# Patient Record
Sex: Male | Born: 1955 | Race: White | Hispanic: No | Marital: Married | Smoking: Never smoker
Health system: Southern US, Community
[De-identification: ages and names within clinical notes are randomized; demographics above are authoritative.]

## PROBLEM LIST (undated history)

## (undated) DIAGNOSIS — M75100 Unspecified rotator cuff tear or rupture of unspecified shoulder, not specified as traumatic: Secondary | ICD-10-CM

## (undated) DIAGNOSIS — G8929 Other chronic pain: Secondary | ICD-10-CM

## (undated) DIAGNOSIS — E785 Hyperlipidemia, unspecified: Secondary | ICD-10-CM

## (undated) DIAGNOSIS — K279 Peptic ulcer, site unspecified, unspecified as acute or chronic, without hemorrhage or perforation: Secondary | ICD-10-CM

## (undated) DIAGNOSIS — E119 Type 2 diabetes mellitus without complications: Secondary | ICD-10-CM

## (undated) DIAGNOSIS — D649 Anemia, unspecified: Secondary | ICD-10-CM

## (undated) HISTORY — DX: Type 2 diabetes mellitus without complications: E11.9

## (undated) HISTORY — DX: Peptic ulcer, site unspecified, unspecified as acute or chronic, without hemorrhage or perforation: K27.9

## (undated) HISTORY — DX: Hyperlipidemia, unspecified: E78.5

## (undated) HISTORY — DX: Anemia, unspecified: D64.9

## (undated) HISTORY — PX: BACK SURGERY: SHX140

## (undated) HISTORY — DX: Other chronic pain: G89.29

---

## 1998-07-29 ENCOUNTER — Inpatient Hospital Stay (HOSPITAL_COMMUNITY): Admission: EM | Admit: 1998-07-29 | Discharge: 1998-07-31 | Payer: Self-pay | Admitting: Emergency Medicine

## 1998-07-29 ENCOUNTER — Encounter: Payer: Self-pay | Admitting: Neurosurgery

## 1998-07-29 ENCOUNTER — Encounter: Payer: Self-pay | Admitting: Emergency Medicine

## 1999-10-23 ENCOUNTER — Encounter: Payer: Self-pay | Admitting: Neurosurgery

## 1999-10-23 ENCOUNTER — Encounter: Admission: RE | Admit: 1999-10-23 | Discharge: 1999-10-23 | Payer: Self-pay | Admitting: Neurosurgery

## 1999-10-28 ENCOUNTER — Encounter: Payer: Self-pay | Admitting: Neurosurgery

## 1999-10-28 ENCOUNTER — Inpatient Hospital Stay (HOSPITAL_COMMUNITY): Admission: EM | Admit: 1999-10-28 | Discharge: 1999-10-30 | Payer: Self-pay | Admitting: Emergency Medicine

## 1999-10-28 ENCOUNTER — Encounter: Payer: Self-pay | Admitting: Emergency Medicine

## 1999-10-29 ENCOUNTER — Encounter: Payer: Self-pay | Admitting: Neurosurgery

## 2002-10-23 ENCOUNTER — Emergency Department (HOSPITAL_COMMUNITY): Admission: EM | Admit: 2002-10-23 | Discharge: 2002-10-23 | Payer: Self-pay | Admitting: Emergency Medicine

## 2004-04-14 ENCOUNTER — Inpatient Hospital Stay (HOSPITAL_COMMUNITY): Admission: RE | Admit: 2004-04-14 | Discharge: 2004-04-20 | Payer: Self-pay | Admitting: Neurosurgery

## 2019-09-04 ENCOUNTER — Encounter: Payer: Self-pay | Admitting: Gastroenterology

## 2019-11-28 ENCOUNTER — Encounter: Payer: Self-pay | Admitting: Gastroenterology

## 2019-11-28 ENCOUNTER — Ambulatory Visit (INDEPENDENT_AMBULATORY_CARE_PROVIDER_SITE_OTHER): Payer: No Typology Code available for payment source | Admitting: Gastroenterology

## 2019-11-28 ENCOUNTER — Other Ambulatory Visit: Payer: Self-pay

## 2019-11-28 DIAGNOSIS — D509 Iron deficiency anemia, unspecified: Secondary | ICD-10-CM | POA: Diagnosis not present

## 2019-11-28 NOTE — Progress Notes (Signed)
Primary Care Physician:  No primary care provider on file.  Referring Physician: Ave Filter  Primary Gastroenterologist:  Dr. Abbey Chatters  Chief Complaint  Patient presents with  . Colonoscopy    never had tcs  . Anemia    HPI:   Charles Blackwell is a 64 y.o. male presenting today at the request of Dr. Vonna Drafts with the [facility] due to chronic IDA. I do not have iron studies, but last Hgb in May 2021 was 12.1. creatinine 0.883. Heme negative per patient.  He denies any abdominal pain, N/V, loss of appetite, dysphagia, GERD, jaundice, pruritis, melena, hematochezia, changes in bowel habits. 15-18 lbs weight loss subjectively over last few years. Unintentionally. States worked a lot and didn't have time to eat.   No prior colonoscopy/EGD. No family history of colorectal cancer or polyps that he is aware. He is not interested in colonoscopy or endoscopy. States he does not want to go through the process of procedures and even if he had a malignancy, he would not want to do anything. States there is no reason to pursue procedures in this case. Ready to go when it is his time. He states he does not know why he is even here.    Past Medical History:  Diagnosis Date  . Chronic back pain   . Diabetes [facility])     Past Surgical History:  Procedure Laterality Date  . BACK SURGERY     X 4, rods and pins, took bone out of hip and put in back, removal of rods     Current Outpatient Medications  Medication Sig Dispense Refill  . atorvastatin (LIPITOR) 10 MG tablet Take 10 mg by mouth daily.    . cholecalciferol (VITAMIN D) 25 MCG (1000 UNIT) tablet Take 1,000 Units by mouth daily.    Marland Kitchen glipiZIDE (GLUCOTROL) 5 MG tablet Take 5 mg by mouth daily before breakfast.    . metFORMIN (GLUCOPHAGE) 500 MG tablet Take 1 tablet by mouth 2 (two) times daily.    . methadone (DOLOPHINE) 5 MG tablet Take 1 tablet by mouth every 12 (twelve) hours.    Marland Kitchen oxyCODONE (OXY IR/ROXICODONE) 5 MG immediate release tablet  Take 5 mg by mouth 3 (three) times daily.     No current facility-administered medications for this visit.    Allergies as of 11/28/2019  . (No Known Allergies)    Family History  Problem Relation Age of Onset  . Cancer Mother        unknown kind of cancer  . Colon cancer Neg Hx   . Colon polyps Neg Hx     Social History   Socioeconomic History  . Marital status: Married    Spouse name: Not on file  . Number of children: Not on file  . Years of education: Not on file  . Highest education level: Not on file  Occupational History  . Occupation: disability  Tobacco Use  . Smoking status: Never Smoker  . Smokeless tobacco: Never Used  Substance and Sexual Activity  . Alcohol use: Never  . Drug use: Not Currently  . Sexual activity: Not on file  Other Topics Concern  . Not on file  Social History Narrative  . Not on file   Social Determinants of Health   Financial Resource Strain:   . Difficulty of Paying Living Expenses: Not on file  Food Insecurity:   . Worried About Charity fundraiser in the Last Year: Not on file  .  Ran Out of Food in the Last Year: Not on file  Transportation Needs:   . Lack of Transportation (Medical): Not on file  . Lack of Transportation (Non-Medical): Not on file  Physical Activity:   . Days of Exercise per Week: Not on file  . Minutes of Exercise per Session: Not on file  Stress:   . Feeling of Stress : Not on file  Social Connections:   . Frequency of Communication with Friends and Family: Not on file  . Frequency of Social Gatherings with Friends and Family: Not on file  . Attends Religious Services: Not on file  . Active Member of Clubs or Organizations: Not on file  . Attends Archivist Meetings: Not on file  . Marital Status: Not on file  Intimate Partner Violence:   . Fear of Current or Ex-Partner: Not on file  . Emotionally Abused: Not on file  . Physically Abused: Not on file  . Sexually Abused: Not on file     Review of Systems: Gen: see HPI CV: Denies chest pain, heart palpitations, peripheral edema, syncope.  Resp: Denies shortness of breath at rest or with exertion. Denies wheezing or cough.  GI: see HPI GU : Denies urinary burning, urinary frequency, urinary hesitancy MS: chronic back pain  Derm: Denies rash, itching, dry skin Psych: Denies depression, anxiety, memory loss, and confusion Heme: Denies bruising, bleeding, and enlarged lymph nodes.  Physical Exam: BP 125/70   Pulse 67   Temp 97.7 F (36.5 C) (Temporal)   Ht 6' (1.829 m)   Wt 200 lb 12.8 oz (91.1 kg)   BMI 27.23 kg/m  General:   Alert and oriented. Flat affect.  Head:  Normocephalic and atraumatic. Eyes:  Without icterus, sclera clear and conjunctiva pink.  Ears:  Normal auditory acuity. Mouth:  Mask in place Lungs:  Clear to auscultation bilaterally. No wheezes, rales, or rhonchi. No distress.  Heart:  S1, S2 present with possible slight systolic murmur Abdomen:  +BS, soft, non-tender and non-distended. No HSM noted. No guarding or rebound. No masses appreciated.  Rectal:  Deferred  Msk:  Symmetrical without gross deformities. Normal posture. Extremities:  Without edema. Neurologic:  Alert and  oriented x4;  grossly normal neurologically. Skin:  Intact without significant lesions or rashes. Psych:  Alert and cooperative.  ASSESSMENT/Plan: [name] is a 64 y.o. male presenting today with chronic IDA per referral notes, last Hgb 12.1, hemoccult negative per patient. I do not have additional iron studies at time of visit. No prior endoscopic procedures and no family history of colorectal cancer or polyps.  He is declining any endoscopic evaluation at this time, stating it would not matter as he does not want to do anything if malignancy was found. I discussed at length with him potential differentials including but not limited to malignancy; I discussed with him that pursuing diagnostic procedures would  investigate GI source for IDA, which is concerning. He is fully aware of this and still declining.   Quite flat affect today, and I am unsure of his baseline. No concern for self-harm. I encouraged him to still consider diagnostic procedures and to call if he changes his mind. As he does not want to pursue any further testing, we will see him back as needed.    Annitta Needs, PhD, ANP-BC [facility] Gastroenterology

## 2019-11-28 NOTE — Patient Instructions (Signed)
Please let me know if you change your mind about the procedures. We would schedule an upper endoscopy and colonoscopy for you.  I am very sorry about your wait time today. Thank you for being understanding.  It was a pleasure to see you today. I want to create trusting relationships with patients to provide genuine, compassionate, and quality care. I value your feedback. If you receive a survey regarding your visit,  I greatly appreciate you taking time to fill this out.   Annitta Needs, PhD, ANP-BC [facility] Gastroenterology

## 2020-06-03 DIAGNOSIS — K922 Gastrointestinal hemorrhage, unspecified: Secondary | ICD-10-CM

## 2020-06-03 HISTORY — DX: Gastrointestinal hemorrhage, unspecified: K92.2

## 2020-06-18 ENCOUNTER — Encounter: Payer: Self-pay | Admitting: Cardiology

## 2020-06-18 NOTE — Progress Notes (Signed)
Cardiology on-call:  Received a page from [facility] stating that [facility] ER provider and would like to speak to cardiology on call.  Spoke to the ER physician Dr. Ramiro Harvest regarding Mr. Duerson who presents to their ER complaining of chest pain and provider concerned for unstable angina.  Based on the brief discussion over the phone it appears that the patient comes in with chest pain that has been progressive.  Current work-up in the ED notes hemoglobin of 4 g/dL.  High sensitive troponin 17 and second troponin pending.  BNP mildly elevated.  They are requesting a transfer to [facility].  I informed Dr. Ramiro Harvest that the patient's chest discomfort is most likely secondary to supply demand ischemia given his severe symptomatic anemia.  He needs to be admitted to hospitalist service with GI consult and will require blood transfusions and reevaluation with regards to his chest discomfort.  Given his other noncardiac conditions he should be admitted to hospitalist team and cardiology will follow the patient as consultants when he presents to [facility].  I will request hospitalist team to page once the patient comes to [facility].  Dr. Ramiro Harvest agreed with the plan of care and was thankful for taking the phone call.  Rex Kras, [place], [facility]  Pager: [phone] Office: [phone]

## 2020-06-20 ENCOUNTER — Inpatient Hospital Stay (HOSPITAL_COMMUNITY)
Admission: AD | Admit: 2020-06-20 | Discharge: 2020-06-22 | DRG: 378 | Disposition: A | Discharge status: Home / Self Care | Payer: No Typology Code available for payment source | Source: Other Acute Inpatient Hospital | Attending: Internal Medicine | Admitting: Internal Medicine

## 2020-06-20 DIAGNOSIS — Z79890 Hormone replacement therapy (postmenopausal): Secondary | ICD-10-CM | POA: Diagnosis not present

## 2020-06-20 DIAGNOSIS — I452 Bifascicular block: Secondary | ICD-10-CM | POA: Diagnosis present

## 2020-06-20 DIAGNOSIS — E119 Type 2 diabetes mellitus without complications: Secondary | ICD-10-CM

## 2020-06-20 DIAGNOSIS — K2100 Gastro-esophageal reflux disease with esophagitis, without bleeding: Secondary | ICD-10-CM | POA: Diagnosis present

## 2020-06-20 DIAGNOSIS — E7800 Pure hypercholesterolemia, unspecified: Secondary | ICD-10-CM

## 2020-06-20 DIAGNOSIS — Z8249 Family history of ischemic heart disease and other diseases of the circulatory system: Secondary | ICD-10-CM | POA: Diagnosis not present

## 2020-06-20 DIAGNOSIS — M545 Low back pain, unspecified: Secondary | ICD-10-CM | POA: Diagnosis present

## 2020-06-20 DIAGNOSIS — D124 Benign neoplasm of descending colon: Secondary | ICD-10-CM | POA: Diagnosis present

## 2020-06-20 DIAGNOSIS — K259 Gastric ulcer, unspecified as acute or chronic, without hemorrhage or perforation: Secondary | ICD-10-CM | POA: Diagnosis not present

## 2020-06-20 DIAGNOSIS — G8929 Other chronic pain: Secondary | ICD-10-CM | POA: Diagnosis present

## 2020-06-20 DIAGNOSIS — R072 Precordial pain: Secondary | ICD-10-CM | POA: Diagnosis present

## 2020-06-20 DIAGNOSIS — K635 Polyp of colon: Secondary | ICD-10-CM | POA: Diagnosis not present

## 2020-06-20 DIAGNOSIS — I249 Acute ischemic heart disease, unspecified: Secondary | ICD-10-CM | POA: Diagnosis not present

## 2020-06-20 DIAGNOSIS — Z79891 Long term (current) use of opiate analgesic: Secondary | ICD-10-CM | POA: Diagnosis not present

## 2020-06-20 DIAGNOSIS — D696 Thrombocytopenia, unspecified: Secondary | ICD-10-CM | POA: Diagnosis present

## 2020-06-20 DIAGNOSIS — D500 Iron deficiency anemia secondary to blood loss (chronic): Secondary | ICD-10-CM | POA: Diagnosis present

## 2020-06-20 DIAGNOSIS — R079 Chest pain, unspecified: Secondary | ICD-10-CM | POA: Diagnosis present

## 2020-06-20 DIAGNOSIS — E291 Testicular hypofunction: Secondary | ICD-10-CM | POA: Diagnosis present

## 2020-06-20 DIAGNOSIS — K250 Acute gastric ulcer with hemorrhage: Secondary | ICD-10-CM | POA: Diagnosis present

## 2020-06-20 DIAGNOSIS — E785 Hyperlipidemia, unspecified: Secondary | ICD-10-CM | POA: Diagnosis present

## 2020-06-20 DIAGNOSIS — I248 Other forms of acute ischemic heart disease: Secondary | ICD-10-CM | POA: Diagnosis present

## 2020-06-20 DIAGNOSIS — Z7984 Long term (current) use of oral hypoglycemic drugs: Secondary | ICD-10-CM | POA: Diagnosis not present

## 2020-06-20 DIAGNOSIS — I209 Angina pectoris, unspecified: Secondary | ICD-10-CM | POA: Diagnosis not present

## 2020-06-20 DIAGNOSIS — R0609 Other forms of dyspnea: Secondary | ICD-10-CM

## 2020-06-20 DIAGNOSIS — Z01810 Encounter for preprocedural cardiovascular examination: Secondary | ICD-10-CM

## 2020-06-20 DIAGNOSIS — R0600 Dyspnea, unspecified: Secondary | ICD-10-CM

## 2020-06-20 DIAGNOSIS — D509 Iron deficiency anemia, unspecified: Secondary | ICD-10-CM | POA: Diagnosis not present

## 2020-06-20 DIAGNOSIS — D649 Anemia, unspecified: Secondary | ICD-10-CM

## 2020-06-20 DIAGNOSIS — I4510 Unspecified right bundle-branch block: Secondary | ICD-10-CM

## 2020-06-20 LAB — CBC
HCT: 21.8 % — ABNORMAL LOW (ref 39.0–52.0)
Hemoglobin: 6.7 g/dL — CL (ref 13.0–17.0)
MCH: 24.3 pg — ABNORMAL LOW (ref 26.0–34.0)
MCHC: 30.7 g/dL (ref 30.0–36.0)
MCV: 79 fL — ABNORMAL LOW (ref 80.0–100.0)
Platelets: 154 10*3/uL (ref 150–400)
RBC: 2.76 MIL/uL — ABNORMAL LOW (ref 4.22–5.81)
RDW: 18.6 % — ABNORMAL HIGH (ref 11.5–15.5)
WBC: 6.3 10*3/uL (ref 4.0–10.5)
nRBC: 0 % (ref 0.0–0.2)

## 2020-06-20 LAB — FOLATE: Folate: 12.5 ng/mL (ref >5.9)

## 2020-06-20 LAB — COMPREHENSIVE METABOLIC PANEL
ALT: 25 U/L (ref 0–44)
AST: 24 U/L (ref 15–41)
Albumin: 2.6 g/dL — ABNORMAL LOW (ref 3.5–5.0)
Alkaline Phosphatase: 82 U/L (ref 38–126)
Anion gap: 4 — ABNORMAL LOW (ref 5–15)
BUN: 12 mg/dL (ref 8–23)
CO2: 24 mmol/L (ref 22–32)
Calcium: 7.8 mg/dL — ABNORMAL LOW (ref 8.9–10.3)
Chloride: 106 mmol/L (ref 98–111)
Creatinine, Ser: 0.78 mg/dL (ref 0.61–1.24)
GFR, Estimated: >60 mL/min (ref >60)
Glucose, Bld: 136 mg/dL — ABNORMAL HIGH (ref 70–99)
Potassium: 3.7 mmol/L (ref 3.5–5.1)
Sodium: 134 mmol/L — ABNORMAL LOW (ref 135–145)
Total Bilirubin: 0.8 mg/dL (ref 0.3–1.2)
Total Protein: 5.8 g/dL — ABNORMAL LOW (ref 6.5–8.1)

## 2020-06-20 LAB — HIV ANTIBODY (ROUTINE TESTING W REFLEX): HIV Screen 4th Generation wRfx: NONREACTIVE

## 2020-06-20 LAB — RETICULOCYTES
Immature Retic Fract: 26.9 % — ABNORMAL HIGH (ref 2.3–15.9)
RBC.: 2.72 MIL/uL — ABNORMAL LOW (ref 4.22–5.81)
Retic Count, Absolute: 60.5 10*3/uL (ref 19.0–186.0)
Retic Ct Pct: 2.3 % (ref 0.4–3.1)

## 2020-06-20 LAB — TROPONIN I (HIGH SENSITIVITY): Troponin I (High Sensitivity): 8 ng/L (ref <18)

## 2020-06-20 LAB — TSH: TSH: 2.121 u[IU]/mL (ref 0.350–4.500)

## 2020-06-20 LAB — ABO/RH: ABO/RH(D): O POS

## 2020-06-20 LAB — IRON AND TIBC
Iron: 11 ug/dL — ABNORMAL LOW (ref 45–182)
Saturation Ratios: 3 % — ABNORMAL LOW (ref 17.9–39.5)
TIBC: 426 ug/dL (ref 250–450)
UIBC: 415 ug/dL

## 2020-06-20 LAB — BRAIN NATRIURETIC PEPTIDE: B Natriuretic Peptide: 168.1 pg/mL — ABNORMAL HIGH (ref 0.0–100.0)

## 2020-06-20 LAB — FERRITIN: Ferritin: 6 ng/mL — ABNORMAL LOW (ref 24–336)

## 2020-06-20 LAB — VITAMIN B12: Vitamin B-12: 385 pg/mL (ref 180–914)

## 2020-06-20 LAB — MAGNESIUM: Magnesium: 1.9 mg/dL (ref 1.7–2.4)

## 2020-06-20 LAB — PREPARE RBC (CROSSMATCH)

## 2020-06-20 LAB — LACTATE DEHYDROGENASE: LDH: 149 U/L (ref 98–192)

## 2020-06-20 MED ORDER — SODIUM CHLORIDE 0.9 % IV SOLN: 250.0000 mL | INTRAVENOUS | Status: DC | PRN

## 2020-06-20 MED ORDER — ATORVASTATIN CALCIUM 10 MG PO TABS
10.0000 mg | ORAL_TABLET | Freq: Every day | ORAL | Status: DC
  Administered 2020-06-20 – 2020-06-22 (×3): 10 mg via ORAL
  Filled 2020-06-20 (×3): qty 1

## 2020-06-20 MED ORDER — SODIUM CHLORIDE 0.9% FLUSH
3.0000 mL | Freq: Two times a day (BID) | INTRAVENOUS | Status: DC
  Administered 2020-06-20 – 2020-06-22 (×4): 3 mL via INTRAVENOUS

## 2020-06-20 MED ORDER — SODIUM CHLORIDE 0.9% FLUSH: 3.0000 mL | INTRAVENOUS | Status: DC | PRN

## 2020-06-20 MED ORDER — ACETAMINOPHEN 650 MG RE SUPP: 650.0000 mg | Freq: Four times a day (QID) | RECTAL | Status: DC | PRN

## 2020-06-20 MED ORDER — METHADONE HCL 10 MG PO TABS
20.0000 mg | ORAL_TABLET | Freq: Two times a day (BID) | ORAL | Status: DC
Start: 2020-06-20 — End: 2020-06-22
  Administered 2020-06-20 – 2020-06-22 (×4): 20 mg via ORAL
  Filled 2020-06-20 (×4): qty 2

## 2020-06-20 MED ORDER — SODIUM CHLORIDE 0.9% IV SOLUTION: Freq: Once | INTRAVENOUS | Status: DC

## 2020-06-20 MED ORDER — ACETAMINOPHEN 325 MG PO TABS: 650.0000 mg | ORAL_TABLET | Freq: Four times a day (QID) | ORAL | Status: DC | PRN

## 2020-06-20 NOTE — H&P (Signed)
History and Physical:    Charles Blackwell   ZOX:[id number] DOB: 1955/05/30 DOA: 06/20/2020  Referring MD/provider: Transfer from [facility] PCP: No primary care provider on file.   Patient coming from: Home  Chief Complaint: Exertional angina when hemoglobin was 4  History of Present Illness:   Charles Blackwell is an 65 y.o. male with PMH significant for diabetes and chronic pain on methadone has been feeling tired lately and was started on testosterone and DHEA by his pain medicine doctor 2 months ago.  Patient apparently got 5 shots of testosterone in 2 weeks ago after his last shot of testosterone patient suddenly felt weak and tired.  Over the past 2 weeks patient has gotten progressively weaker and more short of breath.  Noted that approximately a week ago he started having back pain and chest pain with minimal exertion that he had never had before he apparently got so weak that he was unable to walk across the room without being short of breath.  Patient presented to [facility] where he was diagnosed with unstable angina and a transfer to [facility] was requested.  Subsequent work-up revealed hemoglobin of 4.  Patient denies any history of GI bleed in the past although he was told to stop taking ibuprofen in the past when his hemoglobin did fall as an outpatient.  When he stopped the ibuprofen his hemoglobin apparently rebounded to normal.  He is usually followed in the [place] [facility] [place].  He has never been seen by GI or cardiology.  Notes he sends fecal occult blood to the [place] every year and they are always negative.  Patient denies any new medications.  Patient denies any jaundice.  Patient denies alcohol use in the past 25 years.  Prior to that he drank pretty heavily for about 10 to 15 years but minimally since his daughter was born and she is now 26.  Patient states that since he has received 3 units of blood at [facility] he has had no further chest discomfort or back pain.   Notes he is able to walk to the bathroom without any difficulty.  Notes that he feels "back to how I normally feel, not great but not bad either".  Patient specifically denies any recent melenic stool or bright red blood per rectum.  Denies any nausea vomiting or hematemesis.  Denies hemoptysis.  ROS:   ROS   Review of Systems: General: Denies fever, chills, malaise,  Respiratory: Denies cough,  Hemoptysis GU: Denies dysuria, frequency or hematuria Blood/lymphatics: Denies easy bruising or bleeding Mood/affect: Denies anxiety/depression    Past Medical History:   Past Medical History:  Diagnosis Date  . Chronic back pain   . Diabetes [facility])     Past Surgical History:   Past Surgical History:  Procedure Laterality Date  . BACK SURGERY     X 4, rods and pins, took bone out of hip and put in back, removal of rods     Social History:   Social History   Socioeconomic History  . Marital status: Married    Spouse name: Not on file  . Number of children: Not on file  . Years of education: Not on file  . Highest education level: Not on file  Occupational History  . Occupation: disability  Tobacco Use  . Smoking status: Never Smoker  . Smokeless tobacco: Never Used  Substance and Sexual Activity  . Alcohol use: Never  . Drug use: Not Currently  .  Sexual activity: Not on file  Other Topics Concern  . Not on file  Social History Narrative  . Not on file   Social Determinants of Health   Financial Resource Strain: Not on file  Food Insecurity: Not on file  Transportation Needs: Not on file  Physical Activity: Not on file  Stress: Not on file  Social Connections: Not on file  Intimate Partner Violence: Not on file    Allergies   Patient has no known allergies.  Family history:   Family History  Problem Relation Age of Onset  . Cancer Mother        unknown kind of cancer  . Colon cancer Neg Hx   . Colon polyps Neg Hx     Current Medications:   Prior  to Admission medications   Medication Sig Start Date End Date Taking? Authorizing Provider  atorvastatin (LIPITOR) 10 MG tablet Take 10 mg by mouth daily.   Yes [provider]  Cholecalciferol (VITAMIN D-3) 125 MCG (5000 UT) TABS Take 1 tablet by mouth in the morning and at bedtime.   Yes [provider]  DHEA 10 MG TABS Take 1 tablet by mouth daily.   Yes [provider]  glipiZIDE (GLUCOTROL) 5 MG tablet Take 5 mg by mouth daily before breakfast.   Yes [provider]  metFORMIN (GLUCOPHAGE) 500 MG tablet Take 1 tablet by mouth 2 (two) times daily.   Yes [provider]  methadone (DOLOPHINE) 10 MG tablet Take 20 mg by mouth every 12 (twelve) hours. 06/13/20  Yes [provider]  Multiple Vitamins-Minerals (ZINC PO) Take 1 tablet by mouth daily.   Yes [provider]  testosterone cypionate (DEPOTESTOSTERONE CYPIONATE) 200 MG/ML injection Inject 200 mg into the muscle every 14 (fourteen) days. 05/30/20  Yes [provider]  VITAMIN A PO Take 1 tablet by mouth daily.   Yes [provider]  vitamin C (ASCORBIC ACID) 500 MG tablet Take 500 mg by mouth daily.   Yes [provider]    Physical Exam:   Vitals:   06/20/20 1646 06/20/20 1652  BP:  136/64  Pulse: 88 90  Resp:  20  Temp: 99.3 F (37.4 C) 99.3 F (37.4 C)  TempSrc:  Oral  SpO2: 99% 99%  Weight:  96 kg  Height:  5\' 11"  (1.803 m)     Physical Exam: Blood pressure 136/64, pulse 90, temperature 99.3 F (37.4 C), temperature source Oral, resp. rate 20, height 5\' 11"  (1.803 m), weight 96 kg, SpO2 99 %. Gen: Pale gentleman looking somewhat older than stated age surrounded by attentive family. Eyes: sclera anicteric, conjuctiva mildly injected bilaterally CVS: S1-S2, regulary, no gallops Respiratory:  decreased air entry likely secondary to decreased inspiratory effort GI: NABS, somewhat distended, firm but not hard.  Nontender.  No rebound  tenderness and no guarding LE: No edema. No cyanosis Neuro: A/O x 3, Moving all extremities equally with normal strength, CN 3-12 intact, grossly nonfocal.  Psych: patient is logical and coherent, judgement and insight appear normal, mood and affect appropriate to situation. Skin: no rashes or lesions or ulcers,    Data Review:    Labs: Basic Metabolic Panel: No results for input(s): NA, K, CL, CO2, GLUCOSE, BUN, CREATININE, CALCIUM, MG, PHOS in the last 168 hours. Liver Function Tests: No results for input(s): AST, ALT, ALKPHOS, BILITOT, PROT, ALBUMIN in the last 168 hours. No results for input(s): LIPASE, AMYLASE in the last 168 hours. No results  for input(s): AMMONIA in the last 168 hours. CBC: No results for input(s): WBC, NEUTROABS, HGB, HCT, MCV, PLT in the last 168 hours. Cardiac Enzymes: No results for input(s): CKTOTAL, CKMB, CKMBINDEX, TROPONINI in the last 168 hours.  BNP (last 3 results) No results for input(s): PROBNP in the last 8760 hours. CBG: No results for input(s): GLUCAP in the last 168 hours.  Urinalysis No results found for: COLORURINE, APPEARANCEUR, LABSPEC, PHURINE, GLUCOSEU, HGBUR, BILIRUBINUR, KETONESUR, PROTEINUR, UROBILINOGEN, NITRITE, LEUKOCYTESUR    Radiographic Studies: No results found.  EKG: Ordered and pending   Assessment/Plan:   Principal Problem:   Angina pectoris ([place]) Active Problems:   Anemia  65 year old man is transferred from [facility] for work-up of exertional angina and hemoglobin of 4.  He has received 3 units PRBCs at [facility] and feels back to baseline.  No further chest/back discomfort since transfusion.  Angina Very likely demand based given hemoglobin of 4 with resolution since transfusion However patient was discussed with cardiology who recommended transfer to [facility] for cardiac work-up.  Cardiology will need to be called in the morning for formal consultation as he has arrived rather late in the  evening. Echocardiogram and troponins every 6x2 are ordered.   Anemia Patient with initial hemoglobin of 4 however was only symptomatic for 2 weeks prior. Patient received 3 units PRBC, repeat hemoglobin here is 6.7, will transfuse 1 more unit here especially given exertional angina on admission. Patient was hemodynamically stable upon admission to [facility] and stool guaiacs were negative. I have ordered anemia work-up, including hemolysis work-up, here however almost half of his blood is transfused blood so I am not sure how accurate this will be. Patient would probably benefit from a GI work-up in house.  Can consult GI as warranted.  DM Hold glipizide and Metformin SSI AC ordered sensitive dose  Chronic pain Continue methadone 20 mg p.o. twice daily, this dose is been confirmed by pharmacy    Other information:   DVT prophylaxis: SCD ordered. Code Status: Full Family Communication: Entire family was at bedside throughout Disposition Plan: Home Consults called: None today however cardiology and GI can be called in the morning as warranted Admission status: Inpatient  [place] [facility]  If 7PM-7AM, please contact night-coverage [email or web address] Password [facility] 06/20/2020, 6:44 PM

## 2020-06-21 ENCOUNTER — Encounter (HOSPITAL_COMMUNITY): Payer: Self-pay | Admitting: Internal Medicine

## 2020-06-21 ENCOUNTER — Other Ambulatory Visit: Payer: Self-pay

## 2020-06-21 ENCOUNTER — Inpatient Hospital Stay (HOSPITAL_COMMUNITY): Payer: No Typology Code available for payment source

## 2020-06-21 DIAGNOSIS — E119 Type 2 diabetes mellitus without complications: Secondary | ICD-10-CM

## 2020-06-21 DIAGNOSIS — D649 Anemia, unspecified: Secondary | ICD-10-CM

## 2020-06-21 DIAGNOSIS — I249 Acute ischemic heart disease, unspecified: Secondary | ICD-10-CM

## 2020-06-21 DIAGNOSIS — I4510 Unspecified right bundle-branch block: Secondary | ICD-10-CM

## 2020-06-21 DIAGNOSIS — D509 Iron deficiency anemia, unspecified: Secondary | ICD-10-CM

## 2020-06-21 DIAGNOSIS — R0609 Other forms of dyspnea: Secondary | ICD-10-CM

## 2020-06-21 DIAGNOSIS — R0600 Dyspnea, unspecified: Secondary | ICD-10-CM

## 2020-06-21 DIAGNOSIS — Z01810 Encounter for preprocedural cardiovascular examination: Secondary | ICD-10-CM

## 2020-06-21 DIAGNOSIS — I209 Angina pectoris, unspecified: Secondary | ICD-10-CM

## 2020-06-21 DIAGNOSIS — R072 Precordial pain: Secondary | ICD-10-CM

## 2020-06-21 DIAGNOSIS — E7800 Pure hypercholesterolemia, unspecified: Secondary | ICD-10-CM

## 2020-06-21 DIAGNOSIS — D696 Thrombocytopenia, unspecified: Secondary | ICD-10-CM

## 2020-06-21 LAB — TYPE AND SCREEN
ABO/RH(D): O POS
Antibody Screen: NEGATIVE
Unit division: 0

## 2020-06-21 LAB — ECHOCARDIOGRAM COMPLETE
AR max vel: 2.68 cm2
AV Area VTI: 2.58 cm2
AV Area mean vel: 2.83 cm2
AV Mean grad: 7 mmHg
AV Peak grad: 14.3 mmHg
Ao pk vel: 1.89 m/s
Area-P 1/2: 3.11 cm2
Height: 71 in
S' Lateral: 3.9 cm
Weight: 3393.6 oz

## 2020-06-21 LAB — CBC
HCT: 23.5 % — ABNORMAL LOW (ref 39.0–52.0)
Hemoglobin: 7.4 g/dL — ABNORMAL LOW (ref 13.0–17.0)
MCH: 25.2 pg — ABNORMAL LOW (ref 26.0–34.0)
MCHC: 31.5 g/dL (ref 30.0–36.0)
MCV: 79.9 fL — ABNORMAL LOW (ref 80.0–100.0)
Platelets: 126 10*3/uL — ABNORMAL LOW (ref 150–400)
RBC: 2.94 MIL/uL — ABNORMAL LOW (ref 4.22–5.81)
RDW: 18.3 % — ABNORMAL HIGH (ref 11.5–15.5)
WBC: 5.9 10*3/uL (ref 4.0–10.5)
nRBC: 0 % (ref 0.0–0.2)

## 2020-06-21 LAB — BASIC METABOLIC PANEL
Anion gap: 4 — ABNORMAL LOW (ref 5–15)
BUN: 12 mg/dL (ref 8–23)
CO2: 22 mmol/L (ref 22–32)
Calcium: 7.5 mg/dL — ABNORMAL LOW (ref 8.9–10.3)
Chloride: 109 mmol/L (ref 98–111)
Creatinine, Ser: 0.72 mg/dL (ref 0.61–1.24)
GFR, Estimated: >60 mL/min (ref >60)
Glucose, Bld: 96 mg/dL (ref 70–99)
Potassium: 3.8 mmol/L (ref 3.5–5.1)
Sodium: 135 mmol/L (ref 135–145)

## 2020-06-21 LAB — SURGICAL PCR SCREEN
MRSA, PCR: NEGATIVE
Staphylococcus aureus: NEGATIVE

## 2020-06-21 LAB — TROPONIN I (HIGH SENSITIVITY): Troponin I (High Sensitivity): 7 ng/L (ref <18)

## 2020-06-21 LAB — BPAM RBC
Blood Product Expiration Date: 202204212359
ISSUE DATE / TIME: 202203182328
Unit Type and Rh: 5100

## 2020-06-21 MED ORDER — PEG-KCL-NACL-NASULF-NA ASC-C 100 G PO SOLR: 1.0000 | Freq: Once | ORAL | Status: DC

## 2020-06-21 MED ORDER — MUPIROCIN 2 % EX OINT: 1.0000 "application " | TOPICAL_OINTMENT | Freq: Two times a day (BID) | CUTANEOUS | Status: DC

## 2020-06-21 MED ORDER — PEG-KCL-NACL-NASULF-NA ASC-C 100 G PO SOLR
0.5000 | Freq: Once | ORAL | Status: AC
  Administered 2020-06-21: 100 g via ORAL
  Filled 2020-06-21: qty 1

## 2020-06-21 MED ORDER — PEG-KCL-NACL-NASULF-NA ASC-C 100 G PO SOLR
0.5000 | Freq: Once | ORAL | Status: AC
  Administered 2020-06-21: 100 g via ORAL

## 2020-06-21 MED ORDER — BISACODYL 5 MG PO TBEC
20.0000 mg | DELAYED_RELEASE_TABLET | Freq: Once | ORAL | Status: AC
  Administered 2020-06-21: 20 mg via ORAL
  Filled 2020-06-21: qty 4

## 2020-06-21 MED ORDER — FUROSEMIDE 10 MG/ML IJ SOLN
10.0000 mg | Freq: Once | INTRAMUSCULAR | Status: AC
  Administered 2020-06-21: 10 mg via INTRAVENOUS
  Filled 2020-06-21: qty 2

## 2020-06-21 NOTE — H&P (View-Only) (Signed)
[facility] Consult: 11:47 AM 06/21/2020  LOS: 1 day    Referring Provider: DR Broadus John  Primary Care Physician: Baird Cancer. Primary Gastroenterologist: [facility], Roseanne Kaufman, NP.    Reason for Consultation:  Hgb 4.  S/p PRBCs x 4   HPI: Charles Blackwell is a 65 y.o. male.  PMH chronic pain, on methadone.  Diabetes.  Testosterone deficiency treated with injection over the last couple of weeks.  Seen in August 2021 by NP at [place].  Referral from PCP at the [facility] for evaluation of chronic iron deficiency anemia.  Hb in 08/2019 was 12.1.,  Heme negative at that time. No alarming GI symptoms at the time did have 15 to 18 pound weight loss in the setting of not having time to eat. Patient decided against undergoing colonoscopy and endoscopy even if he had a malignancy he would opt not to pursue treatment for this. No procedures were set up by the GI provider as the patient was adamant about not pursuing endoscopic/colonoscopic work-up. Patient says that at the time of his initial diagnosis with low blood counts his stool was heme-negative.  On yearly fecal occult blood testing he is always had negative results.  Stopped taking NSAIDs when he was diagnosed with anemia and says that his blood counts rebounded to some extent  2 weeks of progressive shortness of breath, weakness.  Back and chest pain with minor exertion.  Went to see a PCP in [place] who checked an ECG and sent him to the ED at [facility].  Hgb was 4.  He received 3 units of blood while he was there from Wednesday afternoon through Friday afternoon.  Transferred to [facility] hospital for evaluation received 1 unit PRBCs here.  Speculated to have demand ischemia.  High-sensitivity troponins are not elevated.  Cardiology consult pending. Denies  abdominal pain, anorexia.  Appetite is excellent.  No abdominal pain.  Stools are anywhere from light to medium brown in color, formed.  Never has seen blood or melena.  No altered bowel pattern.  Before last year had never had issues with blood counts, anemia.  Now has Hgb 6.7.  Platelets 126. LFTs, renal function, electrolytes normal. Iron 11, ferritin 6.  B12 and folate okay. 2D echo performed but no reading yet.  Family history negative for colorectal cancer, polyps, anemia. Previously drank heavily but now drinks occasionally..  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    Past Medical History:  Diagnosis Date  . Chronic back pain   . Diabetes [facility])     Past Surgical History:  Procedure Laterality Date  . BACK SURGERY     X 4, rods and pins, took bone out of hip and put in back, removal of rods     Prior to Admission medications   Medication Sig Start Date End Date Taking? Authorizing Provider  atorvastatin (LIPITOR) 10 MG tablet Take 10 mg by mouth daily.   Yes [provider]  Cholecalciferol (VITAMIN D-3) 125 MCG (5000 UT) TABS Take 1 tablet by mouth in the morning  and at bedtime.   Yes [provider]  DHEA 10 MG TABS Take 1 tablet by mouth daily.   Yes [provider]  glipiZIDE (GLUCOTROL) 5 MG tablet Take 5 mg by mouth daily before breakfast.   Yes [provider]  metFORMIN (GLUCOPHAGE) 500 MG tablet Take 1 tablet by mouth 2 (two) times daily.   Yes [provider]  methadone (DOLOPHINE) 10 MG tablet Take 20 mg by mouth every 12 (twelve) hours. 06/13/20  Yes [provider]  Multiple Vitamins-Minerals (ZINC PO) Take 1 tablet by mouth daily.   Yes [provider]  testosterone cypionate (DEPOTESTOSTERONE CYPIONATE) 200 MG/ML injection Inject 200 mg into the muscle every 14 (fourteen) days. 05/30/20  Yes [provider]  VITAMIN A PO Take 1 tablet by mouth daily.   Yes [provider]  vitamin C (ASCORBIC ACID) 500 MG tablet Take 500 mg by mouth daily.   Yes [provider]    Scheduled Meds: . sodium chloride   Intravenous Once  . atorvastatin  10 mg Oral Daily  . methadone  20 mg Oral Q12H  . mupirocin ointment  1 application Nasal BID  . sodium chloride flush  3 mL Intravenous Q12H   Infusions: . sodium chloride     PRN Meds: sodium chloride, acetaminophen **OR** acetaminophen, sodium chloride flush   Allergies as of 06/18/2020  . (No Known Allergies)    Family History  Problem Relation Age of Onset  . Cancer Mother        unknown kind of cancer  . Colon cancer Neg Hx   . Colon polyps Neg Hx     Social History   Socioeconomic History  . Marital status: Married    Spouse name: Not on file  . Number of children: Not on file  . Years of education: Not on file  . Highest education level: Not on file  Occupational History  . Occupation: disability  Tobacco Use  . Smoking status: Never Smoker  . Smokeless tobacco: Never Used  Substance and Sexual Activity  . Alcohol use: Never  . Drug use: Not Currently  . Sexual activity: Not on file  Other Topics Concern  . Not on file  Social History Narrative  . Not on file   Social Determinants of Health   Financial Resource Strain: Not on file  Food Insecurity: Not on file  Transportation Needs: Not on file  Physical Activity: Not on file  Stress: Not on file  Social Connections: Not on file  Intimate Partner Violence: Not on file    REVIEW OF SYSTEMS: Constitutional: Fatigue. ENT:  No nose bleeds Pulm: Shortness of breath.  No cough CV:  No palpitations, no LE edema.  Short-lived exertional chest and back pain. GU:  No hematuria, no frequency GI: See HPI.  Great appetite.  No nausea, vomiting, abdominal pain Heme: No unusual bleeding or bruising. Transfusions: See HPI. Neuro:  No headaches, no peripheral tingling or numbness.  No syncope, no seizures. Derm:  No  itching, no rash or sores.  Endocrine:  No sweats or chills.  No polyuria or dysuria Immunization: Not queried. Travel:  None beyond local counties in last few months.    PHYSICAL EXAM: Vital signs in last 24 hours: Vitals:   06/21/20 0133 06/21/20 0500  BP: 118/68 111/60  Pulse: 78 78  Resp: 16 17  Temp: 98.6 F (37 C) 99.1 F (37.3 C)  SpO2: 96% 96%   Wt Readings  from Last 3 Encounters:  06/21/20 96.2 kg  11/28/19 91.1 kg    General: Patient looks well, appears stated age.  Comfortable. Head: Facial asymmetry or swelling.  No signs of head trauma. Eyes: Conjunctiva pale.  No scleral icterus.  EOMI Ears: Not hard of hearing Nose: No congestion or discharge Mouth: Oropharynx moist, pink, clear.  Tongue midline. Neck: No JVD, no adenopathy, no thyromegaly Lungs: Clear bilaterally with good breath sounds. Heart: RRR. Abdomen: Soft.  Not tender, not distended.  No HSM, masses, bruits, hernias.  Diastases recti..   Rectal: Deferred. Musc/Skeltl: No joint redness, swelling or gross deformity.  Well-healed surgical scar on his lower back. Extremities: No CCE. Neurologic:.  No tremors. Skin: No telangiectasia, rash, sores, suspicious lesions. Nodes: No cervical adenopathy Psych: Pleasant, cooperative, calm.  Intake/Output from previous day: 03/18 0701 - 03/19 0700 In: 363.3 [Blood:363.3] Out: -  Intake/Output this shift: Total I/O In: 360 [P.O.:360] Out: -   LAB RESULTS: Recent Labs    06/20/20 1829 06/21/20 0335  WBC 6.3 5.9  HGB 6.7* 7.4*  HCT 21.8* 23.5*  PLT 154 126*   BMET Lab Results  Component Value Date   NA 135 06/21/2020   NA 134 (L) 06/20/2020   K 3.8 06/21/2020   K 3.7 06/20/2020   CL 109 06/21/2020   CL 106 06/20/2020   CO2 22 06/21/2020   CO2 24 06/20/2020   GLUCOSE 96 06/21/2020   GLUCOSE 136 (H) 06/20/2020   BUN 12 06/21/2020   BUN 12 06/20/2020   CREATININE 0.72 06/21/2020   CREATININE 0.78 06/20/2020   CALCIUM 7.5 (L) 06/21/2020    CALCIUM 7.8 (L) 06/20/2020   LFT Recent Labs    06/20/20 1829  PROT 5.8*  ALBUMIN 2.6*  AST 24  ALT 25  ALKPHOS 82  BILITOT 0.8   PT/INR No results found for: INR, PROTIME Hepatitis Panel No results for input(s): HEPBSAG, HCVAB, HEPAIGM, HEPBIGM in the last 72 hours. C-Diff No components found for: CDIFF Lipase  No results found for: LIPASE  Drugs of Abuse  No results found for: LABOPIA, COCAINSCRNUR, LABBENZ, AMPHETMU, THCU, LABBARB   RADIOLOGY STUDIES: No results found.   IMPRESSION:   *    Profound iron deficiency anemia. Appropriate response to 4 units PRBCs. No prior EGD or colonoscopy.  *   Possible angina, likely due to demand ischemia.  Troponins are negative.  Cardiology consult pending.  Echocardiogram completed, report pending.  *   Noncritical thrombocytopenia.  PLAN:     *    Colonoscopy and possible EGD tomorrow.  See orders for diet and bowel prep.  *   Note that the patient has not had advanced imaging of his abdomen.  No indication for this at this time.   Azucena Freed  06/21/2020, 11:47 AM Phone [phone]

## 2020-06-21 NOTE — Plan of Care (Signed)
  Problem: Education: Goal: Knowledge of General Education information will improve Description: Including pain rating scale, medication(s)/side effects and non-pharmacologic comfort measures Outcome: Progressing   Problem: Health Behavior/Discharge Planning: Goal: Ability to manage health-related needs will improve Outcome: Progressing   Problem: Clinical Measurements: Goal: Ability to maintain clinical measurements within normal limits will improve Outcome: Progressing Goal: Will remain free from infection Outcome: Progressing Goal: Diagnostic test results will improve Outcome: Progressing Goal: Respiratory complications will improve Outcome: Progressing Goal: Cardiovascular complication will be avoided Outcome: Progressing   Problem: Activity: Goal: Risk for activity intolerance will decrease Outcome: Progressing   Problem: Nutrition: Goal: Adequate nutrition will be maintained Outcome: Progressing   Problem: Coping: Goal: Level of anxiety will decrease Outcome: Progressing   Problem: Elimination: Goal: Will not experience complications related to bowel motility Outcome: Progressing Goal: Will not experience complications related to urinary retention Outcome: Progressing   Problem: Elimination: Goal: Will not experience complications related to bowel motility Outcome: Progressing Goal: Will not experience complications related to urinary retention Outcome: Progressing   

## 2020-06-21 NOTE — Progress Notes (Signed)
  Echocardiogram 2D Echocardiogram has been performed.  Merrie Roof F 06/21/2020, 11:22 AM

## 2020-06-21 NOTE — Consult Note (Signed)
CARDIOLOGY CONSULT NOTE  Patient ID: Charles Blackwell MRN: [id number] DOB/AGE: [birth year] 65 y.o.  Admit date: 06/20/2020 Attending physician: Domenic Polite, MD Primary Physician:  No primary care provider on file. Outpatient Cardiologist: None Inpatient Cardiologist: Rex Kras, DO, [facility]  Chief complaint: Chest pain shortness of breath  Reason of consultation: Chest pain and preprocedural risk stratification Requesting physician: Dr. Ramiro Harvest and Dr. Broadus John  HPI:  Charles Blackwell is a 65 y.o. Caucasian male who presents with a chief complaint of " chest pain and shortness of breath." His past medical history and cardiovascular risk factors include: Non-insulin-dependent diabetes mellitus type 2, hyperlipidemia, severe symptomatic anemia.  Patient is accompanied by his wife and daughter at bedside.  Patient states that he has been having shortness of breath with effort related activities and chest discomfort for the last 2 weeks and it has been progressive.  The chest pain was substernal/left-sided.  Pressure-like sensation which relieved with rest.  He denied orthopnea, paroxysmal nocturnal dyspnea or lower extremity swelling.  His wife encouraged him to follow-up with his PCP for further work-up.  Patient states that he went to go see his PCP on Wednesday, June 18, 2020 and was later advised to go to the hospital.  He presented to [facility] in [place] for further work-up.  He was noted to have severe symptomatic anemia at 4 g/dL.  I was called by the ER doctor back on 06/18/2020 for possible cardiovascular evaluation.  I recommended that he get transfused with packed red blood cells and to reevaluate his symptoms as severe symptomatic anemia can cause supply demand ischemia leading to chest pain and dyspnea on exertion.  Patient was stabilized at [facility] and later transferred to [facility] for further evaluation and management.  While he was at [facility] -  he received 3 units of packed  red blood cells and thereafter had no further chest pain and no shortness of breath with effort late activities.  At the time of the evaluation patient denies any chest pain or anginal equivalent.  He is evaluated by gastroenterology earlier today and is recommended to undergo endoscopy and also requesting preprocedural risk stratification by cardiology.  Endoscopies are considered low risk of procedures, no established history of CAD and no active chest pain at the time of evaluation, no history of congestive heart failure, no history of CVA, he has non-insulin-dependent diabetes mellitus type 2, and Scr 2mg  /dL.    ALLERGIES: No Known Allergies  PAST MEDICAL HISTORY: Past Medical History:  Diagnosis Date  . Chronic back pain   . Diabetes ([place])   Hyperlipidemia  PAST SURGICAL HISTORY: Past Surgical History:  Procedure Laterality Date  . BACK SURGERY     X 4, rods and pins, took bone out of hip and put in back, removal of rods     FAMILY HISTORY: The patient family history includes Cancer in his mother.  No family history of premature coronary disease.  His uncle had a myocardial infarction between the ages of 61-60, per patient.   SOCIAL HISTORY:  The patient  reports that he has never smoked. He has never used smokeless tobacco. He reports previous drug use. He reports that he does not drink alcohol.  Works as a dump Administrator.  MEDICATIONS: Current Outpatient Medications  Medication Instructions  . atorvastatin (LIPITOR) 10 mg, Oral, Daily  . Cholecalciferol (VITAMIN D-3) 125 MCG (5000 UT) TABS 1 tablet, Oral, 2 times daily  . DHEA 10 MG TABS 1 tablet, Oral,  Daily  . glipiZIDE (GLUCOTROL) 5 mg, Oral, Daily before breakfast  . metFORMIN (GLUCOPHAGE) 500 MG tablet 1 tablet, Oral, 2 times daily  . methadone (DOLOPHINE) 20 mg, Oral, Every 12 hours  . Multiple Vitamins-Minerals (ZINC PO) 1 tablet, Oral, Daily  . testosterone cypionate (DEPOTESTOSTERONE CYPIONATE) 200 mg,  Intramuscular, Every 14 days  . VITAMIN A PO 1 tablet, Oral, Daily  . vitamin C (ASCORBIC ACID) 500 mg, Oral, Daily    REVIEW OF SYSTEMS: Review of Systems  Constitutional: Negative for chills and fever.  HENT: Negative for hoarse voice and nosebleeds.   Eyes: Negative for discharge, double vision and pain.  Cardiovascular: Negative for chest pain, claudication, dyspnea on exertion, leg swelling, near-syncope, orthopnea, palpitations, paroxysmal nocturnal dyspnea and syncope.  Respiratory: Negative for hemoptysis and shortness of breath.   Musculoskeletal: Negative for muscle cramps and myalgias.  Gastrointestinal: Negative for abdominal pain, constipation, diarrhea, hematemesis, hematochezia, melena, nausea and vomiting.  Neurological: Negative for dizziness and light-headedness.  All other systems reviewed and are negative.   PHYSICAL EXAM: Vitals with BMI 06/21/2020 06/21/2020 06/20/2020  Height - - -  Weight 212 lbs 2 oz - -  BMI 41.9 - -  Systolic 379 024 097  Diastolic 60 68 68  Pulse 78 78 79     Intake/Output Summary (Last 24 hours) at 06/21/2020 1358 Last data filed at 06/21/2020 0800 Gross per 24 hour  Intake 723.33 ml  Output --  Net 723.33 ml    Net IO Since Admission: 723.33 mL [06/21/20 1358]  CONSTITUTIONAL: Well-developed and well-nourished. No acute distress.  SKIN: Skin is warm and dry. No rash noted. No cyanosis. No pallor. No jaundice HEAD: Normocephalic and atraumatic.  EYES: No scleral icterus MOUTH/THROAT: Moist oral membranes.  NECK: No JVD present. No thyromegaly noted. No carotid bruits  LYMPHATIC: No visible cervical adenopathy.  CHEST Normal respiratory effort. No intercostal retractions  LUNGS: Clear to auscultation bilaterally.  No stridor. No wheezes. No rales.  CARDIOVASCULAR: Regular rate and rhythm, positive S1-S2, no murmurs rubs or gallops appreciated. ABDOMINAL:  soft, nontender, nondistended, positive bowel sounds in all 4 quadrants no  apparent ascites.  EXTREMITIES: trace peripheral edema  HEMATOLOGIC: No significant bruising NEUROLOGIC: Oriented to person, place, and time. Nonfocal. Normal muscle tone.  PSYCHIATRIC: Normal mood and affect. Normal behavior. Cooperative  RADIOLOGY: No results found.  LABORATORY DATA: Lab Results  Component Value Date   WBC 5.9 06/21/2020   HGB 7.4 (L) 06/21/2020   HCT 23.5 (L) 06/21/2020   MCV 79.9 (L) 06/21/2020   PLT 126 (L) 06/21/2020    Recent Labs  Lab 06/20/20 1829 06/21/20 0335  NA 134* 135  K 3.7 3.8  CL 106 109  CO2 24 22  BUN 12 12  CREATININE 0.78 0.72  CALCIUM 7.8* 7.5*  PROT 5.8*  --   BILITOT 0.8  --   ALKPHOS 82  --   ALT 25  --   AST 24  --   GLUCOSE 136* 96    Lipid Panel  No results found for: CHOL, TRIG, HDL, CHOLHDL, VLDL, LDLCALC  BNP (last 3 results) Recent Labs    06/20/20 1829  BNP 168.1*    HEMOGLOBIN A1C No results found for: HGBA1C, MPG  Cardiac Panel (last 3 results) No results for input(s): CKTOTAL, CKMB, RELINDX in the last 8760 hours.  Invalid input(s): TROPONINHS  No results found for: CKTOTAL, CKMB, CKMBINDEX   TSH Recent Labs    06/20/20 1829  TSH 2.121  CARDIAC DATABASE: EKG: 06/20/2020: Normal sinus rhythm, 83 bpm, incomplete right bundle branch block, without underlying ischemia or injury pattern.  Echocardiogram: 06/21/2020: 1. Left ventricular ejection fraction, by estimation, is 55 to 60%. Left ventricular ejection fraction by 3D volume is 57 %. The left ventricle has normal function. The left ventricle has no regional wall motion abnormalities. Left ventricular diastolic parameters are consistent with Grade II diastolic dysfunction (pseudonormalization). Elevated left ventricular end-diastolic pressure.  2. Right ventricular systolic function is normal. The right ventricular size is normal.  3. Left atrial size was mildly dilated.  4. Right atrial size was severely dilated.  5. The mitral valve  is normal in structure. No evidence of mitral valve regurgitation. No evidence of mitral stenosis.  6. The aortic valve is tricuspid. Aortic valve regurgitation is not visualized. Mild aortic valve sclerosis is present, with no evidence of aortic valve stenosis.   IMPRESSION & RECOMMENDATIONS: Charles Blackwell is a 65 y.o. Caucasian male whose past medical history and cardiovascular risk factors include: Non-insulin-dependent diabetes mellitus type 2, hyperlipidemia, chronic back pain.  Impression Preprocedural risk stratification Dyspnea on exertion and chest pain: Most likely secondary to supply demand ischemia in the setting of severe symptomatic anemia Elevated BNP Incomplete left bundle branch block Severe symptomatic anemia (hemoglobin on admission 4 g/dL; status post 4 units of packed red blood cells) Non-insulin-dependent diabetes mellitus type 2 Hyperlipidemia Lumbago  Plan: Patient is planned for EGD and colonoscopy for his severe symptomatic anemia with a hemoglobin on presentation 4 g/dL. He has no established history of coronary artery disease, congestive heart failure, CVA.   Currently he does not have any chest pain or anginal equivalent, status post blood transfusions.   Symptoms he had on admission most likely secondary to supply demand ischemia given his low hemoglobin.  Despite that his high sensitive troponins were negative x2 and ECG did not illustrate underlying ischemia or injury pattern.   Patient has non-insulin-dependent diabetes mellitus type 2 and his most recent serum creatinine is <2 mg/dL.   Echocardiogram also notes preserved left ventricular systolic function without regional wall motion of normalities. From a cardiovascular standpoint he is acceptable/low risk from a cardiovascular standpoint for the upcoming noncardiac procedure. This risk assessment is a tool to assist the proceduralist in estimating the cardiac risk for the proposed upcoming noncardiac  procedure.  The shared decision to proceed with EGD/colonoscopy will be ultimately at the discretion of the patient after the procedural risks, benefits, and alternatives have been discussed amongst the patient and his care team. The patient, wife, and daughter find the preprocedural risk stratification acceptable.   Patient does have an elevated BNP most likely secondary to transfusion of 4 units of blood.  Recommend Lasix 10 mg IV push x1.  Check BMP and magnesium level in the morning prior to his procedure.  Echocardiogram results reviewed with the patient and his family at bedside.  Given his other cardiovascular risk factors he would benefit from an ischemic evaluation once his anemia is resolved and as outpatient.  They verbalized understanding.   He is more than welcome to follow-up outpatient with our practice longitudinally or see a cardiologist within the [place] system.  We will follow the patient with you.   Total encounter time 79 minutes. *Total Encounter Time as defined by the Centers for Medicare and Medicaid Services includes, in addition to the face-to-face time of a patient visit (documented in the note above) non-face-to-face time: obtaining and reviewing outside history, ordering and  reviewing medications, tests or procedures, care coordination (communications with other health care professionals or caregivers) and documentation in the medical record.  Patient's questions and concerns were addressed to his satisfaction. He voices understanding of the instructions provided during this encounter.   This note was created using a voice recognition software as a result there may be grammatical errors inadvertently enclosed that do not reflect the nature of this encounter. Every attempt is made to correct such errors.  Mechele Claude [facility]  Pager: [phone] Office: [phone] 06/21/2020, 1:58 PM

## 2020-06-21 NOTE — Consult Note (Signed)
[facility] Consult: 11:47 AM 06/21/2020  LOS: 1 day    Referring Provider: DR Broadus John  Primary Care Physician: Baird Cancer. Primary Gastroenterologist: [facility] gastroenterology, Roseanne Kaufman, NP.    Reason for Consultation:  Hgb 4.  S/p PRBCs x 4   HPI: Charles Blackwell is a 65 y.o. male.  PMH chronic pain, on methadone.  Diabetes.  Testosterone deficiency treated with injection over the last couple of weeks.  Seen in August 2021 by NP at [place].  Referral from PCP at the [facility] for evaluation of chronic iron deficiency anemia.  Hb in 08/2019 was 12.1.,  Heme negative at that time. No alarming GI symptoms at the time did have 15 to 18 pound weight loss in the setting of not having time to eat. Patient decided against undergoing colonoscopy and endoscopy even if he had a malignancy he would opt not to pursue treatment for this. No procedures were set up by the GI provider as the patient was adamant about not pursuing endoscopic/colonoscopic work-up. Patient says that at the time of his initial diagnosis with low blood counts his stool was heme-negative.  On yearly fecal occult blood testing he is always had negative results.  Stopped taking NSAIDs when he was diagnosed with anemia and says that his blood counts rebounded to some extent  2 weeks of progressive shortness of breath, weakness.  Back and chest pain with minor exertion.  Went to see a PCP in [place] who checked an ECG and sent him to the ED at [facility].  Hgb was 4.  He received 3 units of blood while he was there from Wednesday afternoon through Friday afternoon.  Transferred to [facility] hospital for evaluation received 1 unit PRBCs here.  Speculated to have demand ischemia.  High-sensitivity troponins are not elevated.  Cardiology consult pending. Denies  abdominal pain, anorexia.  Appetite is excellent.  No abdominal pain.  Stools are anywhere from light to medium brown in color, formed.  Never has seen blood or melena.  No altered bowel pattern.  Before last year had never had issues with blood counts, anemia.  Now has Hgb 6.7.  Platelets 126. LFTs, renal function, electrolytes normal. Iron 11, ferritin 6.  B12 and folate okay. 2D echo performed but no reading yet.  Family history negative for colorectal cancer, polyps, anemia. Previously drank heavily but now drinks occasionally..  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    Past Medical History:  Diagnosis Date  . Chronic back pain   . Diabetes [facility])     Past Surgical History:  Procedure Laterality Date  . BACK SURGERY     X 4, rods and pins, took bone out of hip and put in back, removal of rods     Prior to Admission medications   Medication Sig Start Date End Date Taking? Authorizing Provider  atorvastatin (LIPITOR) 10 MG tablet Take 10 mg by mouth daily.   Yes [provider]  Cholecalciferol (VITAMIN D-3) 125 MCG (5000 UT) TABS Take 1 tablet by mouth in the morning  and at bedtime.   Yes [provider]  DHEA 10 MG TABS Take 1 tablet by mouth daily.   Yes [provider]  glipiZIDE (GLUCOTROL) 5 MG tablet Take 5 mg by mouth daily before breakfast.   Yes [provider]  metFORMIN (GLUCOPHAGE) 500 MG tablet Take 1 tablet by mouth 2 (two) times daily.   Yes [provider]  methadone (DOLOPHINE) 10 MG tablet Take 20 mg by mouth every 12 (twelve) hours. 06/13/20  Yes [provider]  Multiple Vitamins-Minerals (ZINC PO) Take 1 tablet by mouth daily.   Yes [provider]  testosterone cypionate (DEPOTESTOSTERONE CYPIONATE) 200 MG/ML injection Inject 200 mg into the muscle every 14 (fourteen) days. 05/30/20  Yes [provider]  VITAMIN A PO Take 1 tablet by mouth daily.   Yes [provider]  vitamin C (ASCORBIC ACID) 500 MG tablet Take 500 mg by mouth daily.   Yes [provider]    Scheduled Meds: . sodium chloride   Intravenous Once  . atorvastatin  10 mg Oral Daily  . methadone  20 mg Oral Q12H  . mupirocin ointment  1 application Nasal BID  . sodium chloride flush  3 mL Intravenous Q12H   Infusions: . sodium chloride     PRN Meds: sodium chloride, acetaminophen **OR** acetaminophen, sodium chloride flush   Allergies as of 06/18/2020  . (No Known Allergies)    Family History  Problem Relation Age of Onset  . Cancer Mother        unknown kind of cancer  . Colon cancer Neg Hx   . Colon polyps Neg Hx     Social History   Socioeconomic History  . Marital status: Married    Spouse name: Not on file  . Number of children: Not on file  . Years of education: Not on file  . Highest education level: Not on file  Occupational History  . Occupation: disability  Tobacco Use  . Smoking status: Never Smoker  . Smokeless tobacco: Never Used  Substance and Sexual Activity  . Alcohol use: Never  . Drug use: Not Currently  . Sexual activity: Not on file  Other Topics Concern  . Not on file  Social History Narrative  . Not on file   Social Determinants of Health   Financial Resource Strain: Not on file  Food Insecurity: Not on file  Transportation Needs: Not on file  Physical Activity: Not on file  Stress: Not on file  Social Connections: Not on file  Intimate Partner Violence: Not on file    REVIEW OF SYSTEMS: Constitutional: Fatigue. ENT:  No nose bleeds Pulm: Shortness of breath.  No cough CV:  No palpitations, no LE edema.  Short-lived exertional chest and back pain. GU:  No hematuria, no frequency GI: See HPI.  Great appetite.  No nausea, vomiting, abdominal pain Heme: No unusual bleeding or bruising. Transfusions: See HPI. Neuro:  No headaches, no peripheral tingling or numbness.  No syncope, no seizures. Derm:  No  itching, no rash or sores.  Endocrine:  No sweats or chills.  No polyuria or dysuria Immunization: Not queried. Travel:  None beyond local counties in last few months.    PHYSICAL EXAM: Vital signs in last 24 hours: Vitals:   06/21/20 0133 06/21/20 0500  BP: 118/68 111/60  Pulse: 78 78  Resp: 16 17  Temp: 98.6 F (37 C) 99.1 F (37.3 C)  SpO2: 96% 96%   Wt Readings  from Last 3 Encounters:  06/21/20 96.2 kg  11/28/19 91.1 kg    General: Patient looks well, appears stated age.  Comfortable. Head: Facial asymmetry or swelling.  No signs of head trauma. Eyes: Conjunctiva pale.  No scleral icterus.  EOMI Ears: Not hard of hearing Nose: No congestion or discharge Mouth: Oropharynx moist, pink, clear.  Tongue midline. Neck: No JVD, no adenopathy, no thyromegaly Lungs: Clear bilaterally with good breath sounds. Heart: RRR. Abdomen: Soft.  Not tender, not distended.  No HSM, masses, bruits, hernias.  Diastases recti..   Rectal: Deferred. Musc/Skeltl: No joint redness, swelling or gross deformity.  Well-healed surgical scar on his lower back. Extremities: No CCE. Neurologic:.  No tremors. Skin: No telangiectasia, rash, sores, suspicious lesions. Nodes: No cervical adenopathy Psych: Pleasant, cooperative, calm.  Intake/Output from previous day: 03/18 0701 - 03/19 0700 In: 363.3 [Blood:363.3] Out: -  Intake/Output this shift: Total I/O In: 360 [P.O.:360] Out: -   LAB RESULTS: Recent Labs    06/20/20 1829 06/21/20 0335  WBC 6.3 5.9  HGB 6.7* 7.4*  HCT 21.8* 23.5*  PLT 154 126*   BMET Lab Results  Component Value Date   NA 135 06/21/2020   NA 134 (L) 06/20/2020   K 3.8 06/21/2020   K 3.7 06/20/2020   CL 109 06/21/2020   CL 106 06/20/2020   CO2 22 06/21/2020   CO2 24 06/20/2020   GLUCOSE 96 06/21/2020   GLUCOSE 136 (H) 06/20/2020   BUN 12 06/21/2020   BUN 12 06/20/2020   CREATININE 0.72 06/21/2020   CREATININE 0.78 06/20/2020   CALCIUM 7.5 (L) 06/21/2020    CALCIUM 7.8 (L) 06/20/2020   LFT Recent Labs    06/20/20 1829  PROT 5.8*  ALBUMIN 2.6*  AST 24  ALT 25  ALKPHOS 82  BILITOT 0.8   PT/INR No results found for: INR, PROTIME Hepatitis Panel No results for input(s): HEPBSAG, HCVAB, HEPAIGM, HEPBIGM in the last 72 hours. C-Diff No components found for: CDIFF Lipase  No results found for: LIPASE  Drugs of Abuse  No results found for: LABOPIA, COCAINSCRNUR, LABBENZ, AMPHETMU, THCU, LABBARB   RADIOLOGY STUDIES: No results found.   IMPRESSION:   *    Profound iron deficiency anemia. Appropriate response to 4 units PRBCs. No prior EGD or colonoscopy.  *   Possible angina, likely due to demand ischemia.  Troponins are negative.  Cardiology consult pending.  Echocardiogram completed, report pending.  *   Noncritical thrombocytopenia.  PLAN:     *    Colonoscopy and possible EGD tomorrow.  See orders for diet and bowel prep.  *   Note that the patient has not had advanced imaging of his abdomen.  No indication for this at this time.   Azucena Freed  06/21/2020, 11:47 AM Phone [phone]

## 2020-06-21 NOTE — Progress Notes (Signed)
Transfusion of 1 unit of PRBC completed with no complication. VS post transfusion completed and are WNL.

## 2020-06-21 NOTE — Progress Notes (Addendum)
PROGRESS NOTE    Charles Blackwell  TDV:[id number] DOB: February 17, 1956 DOA: 06/20/2020 PCP: No primary care provider on file.  Brief Narrative: Pleasant 65 year old male with history of diabetes mellitus and chronic low back pain for 30 years on methadone, first noticed being tired 3 to 4 months ago and he was started on testosterone/DHEA by his pain medicine doctor, 1 to 2 weeks ago noted exertional dyspnea and chest tightness, he went to [facility], noted to have a hemoglobin of 4, baseline unknown followed by the [place] [facility] [place], Hemoccult was negative, transfused 3 units of PRBC and transferred to [facility] for further evaluation.  -Ruled out for ACS -Transfused another unit of PRBC overnight   Assessment & Plan:   Severe symptomatic iron deficiency anemia -Hemoglobin 4.0, upon arrival to [facility] ER yesterday, transfused 3 units of PRBC there and 1 unit here, hemoglobin now 7.4 -Anemia panel noted severe iron deficiency, no overt blood loss noted -Has never had a colonoscopy in the past -Will request gastroenterology evaluation  Dyspnea on exertion Possible angina -Secondary to above, EKG without acute ST-T wave changes and high-sensitivity troponins are negative -Follow-up echocardiogram, no further cardiac work-up indicated at this time unless echo is abnormal  Type 2 diabetes mellitus -Hold glipizide and Metformin -Continue sliding scale insulin  Chronic pain -Continue methadone per home regimen  DVT prophylaxis: SCDs Code Status: Full code Family Communication: Discussed patient in detail, no family at bedside Disposition Plan:  Status is: Inpatient  Remains inpatient appropriate because:Inpatient level of care appropriate due to severity of illness   Dispo: The patient is from: Home              Anticipated d/c is to: Home              Patient currently is not medically stable to d/c.   Difficult to place patient No  Consultants:   Gastroenterology  Procedures:   Antimicrobials:    Subjective: -Feels much better after blood transfusion, denies any chest tightness or dyspnea any longer  Objective: Vitals:   06/20/20 2314 06/20/20 2353 06/21/20 0133 06/21/20 0500  BP: 117/63 134/68 118/68 111/60  Pulse: 75 79 78 78  Resp: 16 18 16 17   Temp: 98.9 F (37.2 C) 98.8 F (37.1 C) 98.6 F (37 C) 99.1 F (37.3 C)  TempSrc: Oral Oral Oral Oral  SpO2:   96% 96%  Weight:    96.2 kg  Height:        Intake/Output Summary (Last 24 hours) at 06/21/2020 1003 Last data filed at 06/21/2020 0133 Gross per 24 hour  Intake 363.33 ml  Output -  Net 363.33 ml   Filed Weights   06/20/20 1652 06/21/20 0500  Weight: 96 kg 96.2 kg    Examination:  General exam: Pleasant male sitting up in bed, AAOx3, no distress HEENT: No JVD CVS: S1-S2, regular rate rhythm Lungs: Clear bilaterally Abdomen: Soft, nontender, bowel sounds present Extremities: No edema    Data Reviewed:   CBC: Recent Labs  Lab 06/20/20 1829 06/21/20 0335  WBC 6.3 5.9  HGB 6.7* 7.4*  HCT 21.8* 23.5*  MCV 79.0* 79.9*  PLT 154 062*   Basic Metabolic Panel: Recent Labs  Lab 06/20/20 1829 06/21/20 0335  NA 134* 135  K 3.7 3.8  CL 106 109  CO2 24 22  GLUCOSE 136* 96  BUN 12 12  CREATININE 0.78 0.72  CALCIUM 7.8* 7.5*  MG 1.9  --  GFR: Estimated Creatinine Clearance: 110.4 mL/min (by C-G formula based on SCr of 0.72 mg/dL). Liver Function Tests: Recent Labs  Lab 06/20/20 1829  AST 24  ALT 25  ALKPHOS 82  BILITOT 0.8  PROT 5.8*  ALBUMIN 2.6*   No results for input(s): LIPASE, AMYLASE in the last 168 hours. No results for input(s): AMMONIA in the last 168 hours. Coagulation Profile: No results for input(s): INR, PROTIME in the last 168 hours. Cardiac Enzymes: No results for input(s): CKTOTAL, CKMB, CKMBINDEX, TROPONINI in the last 168 hours. BNP (last 3 results) No results for input(s): PROBNP in the last 8760  hours. HbA1C: No results for input(s): HGBA1C in the last 72 hours. CBG: No results for input(s): GLUCAP in the last 168 hours. Lipid Profile: No results for input(s): CHOL, HDL, LDLCALC, TRIG, CHOLHDL, LDLDIRECT in the last 72 hours. Thyroid Function Tests: Recent Labs    06/20/20 1829  TSH 2.121   Anemia Panel: Recent Labs    06/20/20 1829  VITAMINB12 385  FOLATE 12.5  FERRITIN 6*  TIBC 426  IRON 11*  RETICCTPCT 2.3   Urine analysis: No results found for: COLORURINE, APPEARANCEUR, LABSPEC, PHURINE, GLUCOSEU, HGBUR, BILIRUBINUR, KETONESUR, PROTEINUR, UROBILINOGEN, NITRITE, LEUKOCYTESUR Sepsis Labs: @LABRCNTIP (procalcitonin:4,lacticidven:4)  )No results found for this or any previous visit (from the past 240 hour(s)).       Radiology Studies: No results found.      Scheduled Meds: . sodium chloride   Intravenous Once  . atorvastatin  10 mg Oral Daily  . methadone  20 mg Oral Q12H  . mupirocin ointment  1 application Nasal BID  . sodium chloride flush  3 mL Intravenous Q12H   Continuous Infusions: . sodium chloride       LOS: 1 day    Time spent: 102min  Domenic Polite, MD [facility]  06/21/2020, 10:03 AM

## 2020-06-22 ENCOUNTER — Encounter (HOSPITAL_COMMUNITY): Payer: Self-pay | Admitting: Internal Medicine

## 2020-06-22 ENCOUNTER — Inpatient Hospital Stay (HOSPITAL_COMMUNITY): Payer: No Typology Code available for payment source | Admitting: Certified Registered Nurse Anesthetist

## 2020-06-22 ENCOUNTER — Encounter (HOSPITAL_COMMUNITY)
Admission: AD | Disposition: A | Discharge status: Home / Self Care | Payer: Self-pay | Source: Other Acute Inpatient Hospital | Attending: Internal Medicine

## 2020-06-22 DIAGNOSIS — D124 Benign neoplasm of descending colon: Secondary | ICD-10-CM

## 2020-06-22 DIAGNOSIS — K259 Gastric ulcer, unspecified as acute or chronic, without hemorrhage or perforation: Secondary | ICD-10-CM

## 2020-06-22 DIAGNOSIS — K635 Polyp of colon: Secondary | ICD-10-CM

## 2020-06-22 DIAGNOSIS — K250 Acute gastric ulcer with hemorrhage: Secondary | ICD-10-CM

## 2020-06-22 HISTORY — PX: COLONOSCOPY WITH PROPOFOL: SHX5780

## 2020-06-22 HISTORY — PX: BIOPSY: SHX5522

## 2020-06-22 HISTORY — PX: ESOPHAGOGASTRODUODENOSCOPY (EGD) WITH PROPOFOL: SHX5813

## 2020-06-22 HISTORY — PX: POLYPECTOMY: SHX5525

## 2020-06-22 LAB — COMPREHENSIVE METABOLIC PANEL
ALT: 21 U/L (ref 0–44)
AST: 25 U/L (ref 15–41)
Albumin: 2.4 g/dL — ABNORMAL LOW (ref 3.5–5.0)
Alkaline Phosphatase: 76 U/L (ref 38–126)
Anion gap: 5 (ref 5–15)
BUN: 9 mg/dL (ref 8–23)
CO2: 24 mmol/L (ref 22–32)
Calcium: 7.9 mg/dL — ABNORMAL LOW (ref 8.9–10.3)
Chloride: 107 mmol/L (ref 98–111)
Creatinine, Ser: 0.77 mg/dL (ref 0.61–1.24)
GFR, Estimated: >60 mL/min (ref >60)
Glucose, Bld: 109 mg/dL — ABNORMAL HIGH (ref 70–99)
Potassium: 3.9 mmol/L (ref 3.5–5.1)
Sodium: 136 mmol/L (ref 135–145)
Total Bilirubin: 1.3 mg/dL — ABNORMAL HIGH (ref 0.3–1.2)
Total Protein: 5.3 g/dL — ABNORMAL LOW (ref 6.5–8.1)

## 2020-06-22 LAB — CBC
HCT: 22.8 % — ABNORMAL LOW (ref 39.0–52.0)
Hemoglobin: 7.3 g/dL — ABNORMAL LOW (ref 13.0–17.0)
MCH: 25.5 pg — ABNORMAL LOW (ref 26.0–34.0)
MCHC: 32 g/dL (ref 30.0–36.0)
MCV: 79.7 fL — ABNORMAL LOW (ref 80.0–100.0)
Platelets: 130 10*3/uL — ABNORMAL LOW (ref 150–400)
RBC: 2.86 MIL/uL — ABNORMAL LOW (ref 4.22–5.81)
RDW: 18.6 % — ABNORMAL HIGH (ref 11.5–15.5)
WBC: 4.8 10*3/uL (ref 4.0–10.5)
nRBC: 0 % (ref 0.0–0.2)

## 2020-06-22 LAB — HAPTOGLOBIN: Haptoglobin: 147 mg/dL (ref 32–363)

## 2020-06-22 LAB — MAGNESIUM: Magnesium: 1.8 mg/dL (ref 1.7–2.4)

## 2020-06-22 SURGERY — COLONOSCOPY WITH PROPOFOL
Anesthesia: Monitor Anesthesia Care

## 2020-06-22 MED ORDER — PROPOFOL 500 MG/50ML IV EMUL
INTRAVENOUS | Status: DC | PRN
  Administered 2020-06-22: 100 ug/kg/min via INTRAVENOUS

## 2020-06-22 MED ORDER — SODIUM CHLORIDE 0.9 % IV SOLN
510.0000 mg | Freq: Once | INTRAVENOUS | Status: AC
  Administered 2020-06-22: 510 mg via INTRAVENOUS
  Filled 2020-06-22: qty 17

## 2020-06-22 MED ORDER — PANTOPRAZOLE SODIUM 40 MG PO TBEC: 40.0000 mg | DELAYED_RELEASE_TABLET | Freq: Two times a day (BID) | ORAL | 1 refills | Status: AC

## 2020-06-22 MED ORDER — FERROUS SULFATE 325 (65 FE) MG PO TABS: 325.0000 mg | ORAL_TABLET | Freq: Two times a day (BID) | ORAL | 2 refills | Status: DC

## 2020-06-22 MED ORDER — LACTATED RINGERS IV SOLN: INTRAVENOUS | Status: DC | PRN

## 2020-06-22 MED ORDER — EPHEDRINE SULFATE 50 MG/ML IJ SOLN
INTRAMUSCULAR | Status: DC | PRN
  Administered 2020-06-22: 5 mg via INTRAVENOUS

## 2020-06-22 MED ORDER — PHENYLEPHRINE HCL (PRESSORS) 10 MG/ML IV SOLN
INTRAVENOUS | Status: DC | PRN
  Administered 2020-06-22: 80 ug via INTRAVENOUS
  Administered 2020-06-22: 120 ug via INTRAVENOUS

## 2020-06-22 SURGICAL SUPPLY — 25 items

## 2020-06-22 NOTE — Transfer of Care (Signed)
Immediate Anesthesia Transfer of Care Note  Patient: Charles Blackwell  Procedure(s) Performed: COLONOSCOPY WITH PROPOFOL (N/A ) BIOPSY POLYPECTOMY ESOPHAGOGASTRODUODENOSCOPY (EGD) WITH PROPOFOL (N/A )  Patient Location: PACU  Anesthesia Type:MAC  Level of Consciousness: awake, alert , oriented and patient cooperative  Airway & Oxygen Therapy: Patient Spontanous Breathing  Post-op Assessment: Report given to RN and Post -op Vital signs reviewed and stable  Post vital signs:Reviewed and stable   Last Vitals:  Vitals Value Taken Time  BP 97/54 06/22/20 0904  Temp    Pulse 75 06/22/20 0904  Resp 13 06/22/20 0904  SpO2 100 % 06/22/20 0904  Vitals shown include unvalidated device data.  Last Pain:  Vitals:   06/22/20 0800  TempSrc:   PainSc: 0-No pain      Patients Stated Pain Goal: 0 (32/67/12 4580)  Complications: No complications documented.

## 2020-06-22 NOTE — Discharge Instructions (Signed)
Peptic Ulcer  A peptic ulcer is a painful sore in the lining of your stomach or the first part of your small intestine. What are the causes? Common causes of this condition include:  An infection.  Using certain pain medicines too often or too much. What increases the risk? You are more likely to get this condition if you:  Smoke.  Have a family history of ulcer disease.  Drink alcohol.  Have been hospitalized in an intensive care unit (ICU). What are the signs or symptoms? Symptoms include:  Burning pain in the area between the chest and the belly button. The pain may: ? Not go away (be persistent). ? Be worse when your stomach is empty. ? Be worse at night.  Heartburn.  Feeling sick to your stomach (nauseous) and throwing up (vomiting).  Bloating. If the ulcer results in bleeding, it can cause you to:  Have poop (stool) that is black and looks like tar.  Throw up bright red blood.  Throw up material that looks like coffee grounds. How is this treated? Treatment for this condition may include:  Stopping things that can cause the ulcer, such as: ? Smoking. ? Using pain medicines.  Medicines to reduce stomach acid.  Antibiotic medicines if the ulcer is caused by an infection.  A procedure that is done using a small, flexible tube that has a camera at the end (upper endoscopy). This may be done if you have a bleeding ulcer.  Surgery. This may be needed if: ? You have a lot of bleeding. ? The ulcer caused a hole somewhere in the digestive system. Follow these instructions at home:  Do not drink alcohol if your doctor tells you not to drink.  Limit how much caffeine you take in.  Do not use any products that contain nicotine or tobacco, such as cigarettes, e-cigarettes, and chewing tobacco. If you need help quitting, ask your doctor.  Take over-the-counter and prescription medicines only as told by your doctor. ? Do not stop or change your medicines unless  you talk with your doctor about it first. ? Do not take aspirin, ibuprofen, or other NSAIDs unless your doctor told you to do so.  Keep all follow-up visits as told by your doctor. This is important. Contact a doctor if:  You do not get better in 7 days after you start treatment.  You keep having an upset stomach (indigestion) or heartburn. Get help right away if:  You have sudden, sharp pain in your belly (abdomen).  You have belly pain that does not go away.  You have bloody poop (stool) or black, tarry poop.  You throw up blood. It may look like coffee grounds.  You feel light-headed or feel like you may pass out (faint).  You get weak.  You get sweaty or feel sticky and cold to the touch (clammy). Summary  Symptoms of a peptic ulcer include burning pain in the area between the chest and the belly button.  Take medicines only as told by your doctor.  Limit how much alcohol and caffeine you have.  Keep all follow-up visits as told by your doctor. This information is not intended to replace advice given to you by your health care provider. Make sure you discuss any questions you have with your health care provider. Document Revised: 09/27/2017 Document Reviewed: 09/27/2017 Elsevier Patient Education  2021 [employer].    Colon Polyps  Colon polyps are tissue growths inside the colon, which is part of the  large intestine. They are one of the types of polyps that can grow in the body. A polyp may be a round bump or a mushroom-shaped growth. You could have one polyp or more than one. Most colon polyps are noncancerous (benign). However, some colon polyps can become cancerous over time. Finding and removing the polyps early can help prevent this. What are the causes? The exact cause of colon polyps is not known. What increases the risk? The following factors may make you more likely to develop this condition:  Having a family history of colorectal cancer or colon  polyps.  Being older than 65 years of age.  Being younger than 65 years of age and having a significant family history of colorectal cancer or colon polyps or a genetic condition that puts you at higher risk of getting colon polyps.  Having inflammatory bowel disease, such as ulcerative colitis or Crohn's disease.  Having certain conditions passed from parent to child (hereditary conditions), such as: ? Familial adenomatous polyposis (FAP). ? Lynch syndrome. ? Turcot syndrome. ? Peutz-Jeghers syndrome. ? MUTYH-associated polyposis (MAP).  Being overweight.  Certain lifestyle factors. These include smoking cigarettes, drinking too much alcohol, not getting enough exercise, and eating a diet that is high in fat and red meat and low in fiber.  Having had childhood cancer that was treated with radiation of the abdomen. What are the signs or symptoms? Many times, there are no symptoms. If you have symptoms, they may include:  Blood coming from the rectum during a bowel movement.  Blood in the stool (feces). The blood may be bright red or very dark in color.  Pain in the abdomen.  A change in bowel habits, such as constipation or diarrhea. How is this diagnosed? This condition is diagnosed with a colonoscopy. This is a procedure in which a lighted, flexible scope is inserted into the opening between the buttocks (anus) and then passed into the colon to examine the area. Polyps are sometimes found when a colonoscopy is done as part of routine cancer screening tests. How is this treated? This condition is treated by removing any polyps that are found. Most polyps can be removed during a colonoscopy. Those polyps will then be tested for cancer. Additional treatment may be needed depending on the results of testing. Follow these instructions at home: Eating and drinking  Eat foods that are high in fiber, such as fruits, vegetables, and whole grains.  Eat foods that are high in calcium  and vitamin D, such as milk, cheese, yogurt, eggs, liver, fish, and broccoli.  Limit foods that are high in fat, such as fried foods and desserts.  Limit the amount of red meat, precooked or cured meat, or other processed meat that you eat, such as hot dogs, sausages, bacon, or meat loaves.  Limit sugary drinks.   Lifestyle  Maintain a healthy weight, or lose weight if recommended by your health care provider.  Exercise every day or as told by your health care provider.  Do not use any products that contain nicotine or tobacco, such as cigarettes, e-cigarettes, and chewing tobacco. If you need help quitting, ask your health care provider.  Do not drink alcohol if: ? Your health care provider tells you not to drink. ? You are pregnant, may be pregnant, or are planning to become pregnant.  If you drink alcohol: ? Limit how much you use to:  0-1 drink a day for women.  0-2 drinks a day for men. ? Know  how much alcohol is in your drink. In the [place] one drink equals one 12 oz bottle of beer (355 mL), one 5 oz glass of wine (148 mL), or one 1 oz glass of hard liquor (44 mL). General instructions  Take over-the-counter and prescription medicines only as told by your health care provider.  Keep all follow-up visits. This is important. This includes having regularly scheduled colonoscopies. Talk to your health care provider about when you need a colonoscopy. Contact a health care provider if:  You have new or worsening bleeding during a bowel movement.  You have new or increased blood in your stool.  You have a change in bowel habits.  You lose weight for no known reason. Summary  Colon polyps are tissue growths inside the colon, which is part of the large intestine. They are one type of polyp that can grow in the body.  Most colon polyps are noncancerous (benign), but some can become cancerous over time.  This condition is diagnosed with a colonoscopy.  This condition is  treated by removing any polyps that are found. Most polyps can be removed during a colonoscopy. This information is not intended to replace advice given to you by your health care provider. Make sure you discuss any questions you have with your health care provider. Document Revised: 07/11/2019 Document Reviewed: 07/11/2019 Elsevier Patient Education  [place].   [employer] medicine (25th ed., pp. [phone]). [place], [place]: Elsevier.">  Anemia  Anemia is a condition in which there is not enough red blood cells or hemoglobin in the blood. Hemoglobin is a substance in red blood cells that carries oxygen. When you do not have enough red blood cells or hemoglobin (are anemic), your body cannot get enough oxygen and your organs may not work properly. As a result, you may feel very tired or have other problems. What are the causes? Common causes of anemia include:  Excessive bleeding. Anemia can be caused by excessive bleeding inside or outside the body, including bleeding from the intestines or from heavy menstrual periods in females.  Poor nutrition.  Long-lasting (chronic) kidney, thyroid, and liver disease.  Bone marrow disorders, spleen problems, and blood disorders.  Cancer and treatments for cancer.  HIV (human immunodeficiency virus) and AIDS (acquired immunodeficiency syndrome).  Infections, medicines, and autoimmune disorders that destroy red blood cells. What are the signs or symptoms? Symptoms of this condition include:  Minor weakness.  Dizziness.  Headache, or difficulties concentrating and sleeping.  Heartbeats that feel irregular or faster than normal (palpitations).  Shortness of breath, especially with exercise.  Pale skin, lips, and nails, or cold hands and feet.  Indigestion and nausea. Symptoms may occur suddenly or develop slowly. If your anemia is mild, you may not have symptoms. How is this diagnosed? This condition is diagnosed based on  blood tests, your medical history, and a physical exam. In some cases, a test may be needed in which cells are removed from the soft tissue inside of a bone and looked at under a microscope (bone marrow biopsy). Your health care provider may also check your stool (feces) for blood and may do additional testing to look for the cause of your bleeding. Other tests may include:  Imaging tests, such as a CT scan or MRI.  A procedure to see inside your esophagus and stomach (endoscopy).  A procedure to see inside your colon and rectum (colonoscopy). How is this treated? Treatment for this condition depends on the cause. If you continue  to lose a lot of blood, you may need to be treated at a hospital. Treatment may include:  Taking supplements of iron, vitamin M19, or folic acid.  Taking a hormone medicine (erythropoietin) that can help to stimulate red blood cell growth.  Having a blood transfusion. This may be needed if you lose a lot of blood.  Making changes to your diet.  Having surgery to remove your spleen. Follow these instructions at home:  Take over-the-counter and prescription medicines only as told by your health care provider.  Take supplements only as told by your health care provider.  Follow any diet instructions that you were given by your health care provider.  Keep all follow-up visits as told by your health care provider. This is important. Contact a health care provider if:  You develop new bleeding anywhere in the body. Get help right away if:  You are very weak.  You are short of breath.  You have pain in your abdomen or chest.  You are dizzy or feel faint.  You have trouble concentrating.  You have bloody stools, black stools, or tarry stools.  You vomit repeatedly or you vomit up blood. These symptoms may represent a serious problem that is an emergency. Do not wait to see if the symptoms will go away. Get medical help right away. Call your local  emergency services (911 in the [place] Do not drive yourself to the hospital. Summary  Anemia is a condition in which you do not have enough red blood cells or enough of a substance in your red blood cells that carries oxygen (hemoglobin).  Symptoms may occur suddenly or develop slowly.  If your anemia is mild, you may not have symptoms.  This condition is diagnosed with blood tests, a medical history, and a physical exam. Other tests may be needed.  Treatment for this condition depends on the cause of the anemia. This information is not intended to replace advice given to you by your health care provider. Make sure you discuss any questions you have with your health care provider. Document Revised: 02/27/2019 Document Reviewed: 02/27/2019 Elsevier Patient Education  2021 [employer].

## 2020-06-22 NOTE — Progress Notes (Signed)
Progress Note  Patient Name: Charles Blackwell Date of Encounter: 06/22/2020  Attending physician: No att. providers found Primary care provider: No primary care provider on file. Primary Cardiologist:  Consultant:Lorilynn Lehr Terri Skains, DO  Subjective: Charles Blackwell is a 65 y.o. male who was seen and examined at bedside at approximately 11:10 AM No events overnight. Status post endoscopy Denies chest pain, shortness of breath, orthopnea, paroxysmal nocturnal dyspnea or lower extremity swelling. Patient scheduled to get IV iron. Endoscopy results noted below. Case discussed and reviewed with his nurse.  Objective: Vital Signs in the last 24 hours: Temp:  [97.8 F (36.6 C)-99.2 F (37.3 C)] 98 F (36.7 C) (03/20 0910) Pulse Rate:  [65-75] 73 (03/20 0922) Resp:  [12-18] 18 (03/20 0922) BP: (97-120)/(52-70) 113/59 (03/20 0922) SpO2:  [95 %-100 %] 97 % (03/20 0922) Weight:  [96.2 kg] 96.2 kg (03/20 0748)  Intake/Output:  Intake/Output Summary (Last 24 hours) at 06/22/2020 1300 Last data filed at 06/22/2020 1216 Gross per 24 hour  Intake 100 ml  Output --  Net 100 ml    Net IO Since Admission: 823.33 mL [06/22/20 1300]  Weights:  Filed Weights   06/20/20 1652 06/21/20 0500 06/22/20 0748  Weight: 96 kg 96.2 kg 96.2 kg    Telemetry: Personally reviewed.  Normal sinus rhythm  Physical examination: PHYSICAL EXAM: Vitals with BMI 06/22/2020 06/22/2020 06/22/2020  Height - - -  Weight - - -  BMI - - -  Systolic 161 - 97  Diastolic 59 - 52  Pulse 73 68 70    CONSTITUTIONAL: Well-developed and well-nourished. No acute distress.  SKIN: Skin is warm and dry. No rash noted. No cyanosis. No pallor. No jaundice HEAD: Normocephalic and atraumatic.  EYES: No scleral icterus MOUTH/THROAT: Moist oral membranes.  NECK: No JVD present. No thyromegaly noted. No carotid bruits  LYMPHATIC: No visible cervical adenopathy.  CHEST Normal respiratory effort. No intercostal retractions  LUNGS: Clear to  auscultation bilaterally.  No stridor. No wheezes. No rales.  CARDIOVASCULAR: Regular rate and rhythm, positive S1-S2, no murmurs rubs or gallops appreciated ABDOMINAL: No apparent ascites.  EXTREMITIES: No peripheral edema  HEMATOLOGIC: No significant bruising NEUROLOGIC: Oriented to person, place, and time. Nonfocal. Normal muscle tone.  PSYCHIATRIC: Normal mood and affect. Normal behavior. Cooperative  Lab Results: Hematology Recent Labs  Lab 06/20/20 1829 06/21/20 0335 06/22/20 0210  WBC 6.3 5.9 4.8  RBC 2.76*  2.72* 2.94* 2.86*  HGB 6.7* 7.4* 7.3*  HCT 21.8* 23.5* 22.8*  MCV 79.0* 79.9* 79.7*  MCH 24.3* 25.2* 25.5*  MCHC 30.7 31.5 32.0  RDW 18.6* 18.3* 18.6*  PLT 154 126* 130*    Chemistry Recent Labs  Lab 06/20/20 1829 06/21/20 0335 06/22/20 0210  NA 134* 135 136  K 3.7 3.8 3.9  CL 106 109 107  CO2 24 22 24   GLUCOSE 136* 96 109*  BUN 12 12 9   CREATININE 0.78 0.72 0.77  CALCIUM 7.8* 7.5* 7.9*  PROT 5.8*  --  5.3*  ALBUMIN 2.6*  --  2.4*  AST 24  --  25  ALT 25  --  21  ALKPHOS 82  --  76  BILITOT 0.8  --  1.3*  GFRNONAA >60 >60 >60  ANIONGAP 4* 4* 5     Cardiac Enzymes: Cardiac Panel (last 3 results) Recent Labs    06/20/20 1829 06/21/20 0335  TROPONINIHS 8 7    BNP (last 3 results) Recent Labs    06/20/20 1829  BNP 168.1*  ProBNP (last 3 results) No results for input(s): PROBNP in the last 8760 hours.   DDimer No results for input(s): DDIMER in the last 168 hours.   Hemoglobin A1c: No results found for: HGBA1C, MPG  TSH  Recent Labs    06/20/20 1829  TSH 2.121    Lipid Panel No results found for: CHOL, TRIG, HDL, CHOLHDL, VLDL, LDLCALC, LDLDIRECT  Imaging: ECHOCARDIOGRAM COMPLETE  Result Date: 06/21/2020    ECHOCARDIOGRAM REPORT   Patient Name:   Charles Blackwell Date of Exam: 06/21/2020 Medical Rec #:  [id number]    Height:       71.0 in Accession #:    [id number]   Weight:       212.1 lb Date of Birth:  1956/03/01     BSA:           2.162 m Patient Age:    3 years     BP:           116/60 mmHg Patient Gender: M            HR:           82 bpm. Exam Location:  Inpatient Procedure: 2D Echo, Color Doppler and Cardiac Doppler Indications:    Acute ischemic heart disease I24.9  History:        Patient has no prior history of Echocardiogram examinations.                 Signs/Symptoms:Dyspnea. Anemia.  Sonographer:    Merrie Roof RDCS Referring Phys: [id number] [place]  1. Left ventricular ejection fraction, by estimation, is 55 to 60%. Left ventricular ejection fraction by 3D volume is 57 %. The left ventricle has normal function. The left ventricle has no regional wall motion abnormalities. Left ventricular diastolic  parameters are consistent with Grade II diastolic dysfunction (pseudonormalization). Elevated left ventricular end-diastolic pressure.  2. Right ventricular systolic function is normal. The right ventricular size is normal.  3. Left atrial size was mildly dilated.  4. Right atrial size was severely dilated.  5. The mitral valve is normal in structure. No evidence of mitral valve regurgitation. No evidence of mitral stenosis.  6. The aortic valve is tricuspid. Aortic valve regurgitation is not visualized. Mild aortic valve sclerosis is present, with no evidence of aortic valve stenosis. FINDINGS  Left Ventricle: Left ventricular ejection fraction, by estimation, is 55 to 60%. Left ventricular ejection fraction by 3D volume is 57 %. The left ventricle has normal function. The left ventricle has no regional wall motion abnormalities. The left ventricular internal cavity size was normal in size. There is no left ventricular hypertrophy. Left ventricular diastolic parameters are consistent with Grade II diastolic dysfunction (pseudonormalization). Elevated left ventricular end-diastolic pressure. Right Ventricle: The right ventricular size is normal. No increase in right ventricular wall thickness. Right  ventricular systolic function is normal. Left Atrium: Left atrial size was mildly dilated. Right Atrium: Right atrial size was severely dilated. Pericardium: There is no evidence of pericardial effusion. Mitral Valve: The mitral valve is normal in structure. No evidence of mitral valve regurgitation. No evidence of mitral valve stenosis. Tricuspid Valve: The tricuspid valve is normal in structure. Tricuspid valve regurgitation is mild . No evidence of tricuspid stenosis. Aortic Valve: The aortic valve is tricuspid. Aortic valve regurgitation is not visualized. Mild aortic valve sclerosis is present, with no evidence of aortic valve stenosis. Aortic valve mean gradient measures 7.0 mmHg. Aortic valve peak gradient measures 14.3 mmHg.  Aortic valve area, by VTI measures 2.58 cm. Pulmonic Valve: The pulmonic valve was normal in structure. Pulmonic valve regurgitation is not visualized. No evidence of pulmonic stenosis. Aorta: The aortic root is normal in size and structure. IAS/Shunts: No atrial level shunt detected by color flow Doppler.  LEFT VENTRICLE PLAX 2D LVIDd:         5.70 cm         Diastology LVIDs:         3.90 cm         LV e' medial:    8.70 cm/s LV PW:         1.00 cm         LV E/e' medial:  14.8 LV IVS:        1.00 cm         LV e' lateral:   11.50 cm/s LVOT diam:     2.10 cm         LV E/e' lateral: 11.2 LV SV:         101 LV SV Index:   47 LVOT Area:     3.46 cm        3D Volume EF                                LV 3D EF:    Left                                             ventricular                                             ejection                                             fraction by                                             3D volume                                             is 57 %.                                 3D Volume EF:                                3D EF:        57 %                                LV EDV:       179 ml  LV ESV:       77 ml                                 LV SV:        102 ml RIGHT VENTRICLE RV Basal diam:  3.70 cm LEFT ATRIUM             Index       RIGHT ATRIUM           Index LA diam:        3.60 cm 1.67 cm/m  RA Area:     30.30 cm LA Vol (A2C):   75.3 ml 34.83 ml/m RA Volume:   118.00 ml 54.58 ml/m LA Vol (A4C):   71.0 ml 32.84 ml/m LA Biplane Vol: 74.7 ml 34.55 ml/m  AORTIC VALVE AV Area (Vmax):    2.68 cm AV Area (Vmean):   2.83 cm AV Area (VTI):     2.58 cm AV Vmax:           189.00 cm/s AV Vmean:          125.000 cm/s AV VTI:            0.393 m AV Peak Grad:      14.3 mmHg AV Mean Grad:      7.0 mmHg LVOT Vmax:         146.00 cm/s LVOT Vmean:        102.000 cm/s LVOT VTI:          0.293 m LVOT/AV VTI ratio: 0.75  AORTA Ao Root diam: 3.20 cm Ao Asc diam:  3.00 cm MITRAL VALVE                TRICUSPID VALVE MV Area (PHT): 3.11 cm     TR Peak grad:   39.4 mmHg MV Decel Time: 244 msec     TR Vmax:        314.00 cm/s MV E velocity: 129.00 cm/s MV A velocity: 86.10 cm/s   SHUNTS MV E/A ratio:  1.50         Systemic VTI:  0.29 m                             Systemic Diam: 2.10 cm Fransico Him MD Electronically signed by Fransico Him MD Signature Date/Time: 06/21/2020/2:06:26 PM    Final     CARDIAC DATABASE: EKG: 06/20/2020: Normal sinus rhythm, 83 bpm, incomplete right bundle branch block, without underlying ischemia or injury pattern.  Echocardiogram: 06/21/2020: 1. Left ventricular ejection fraction, by estimation, is 55 to 60%. Left ventricular ejection fraction by 3D volume is 57 %. The left ventricle has normal function. The left ventricle has no regional wall motion abnormalities. Left ventricular diastolic parameters are consistent with Grade II diastolic dysfunction (pseudonormalization). Elevated left ventricular end-diastolic pressure.  2. Right ventricular systolic function is normal. The right ventricular size is normal.  3. Left atrial size was mildly dilated.  4. Right atrial size was severely dilated.   5. The mitral valve is normal in structure. No evidence of mitral valve regurgitation. No evidence of mitral stenosis.  6. The aortic valve is tricuspid. Aortic valve regurgitation is not visualized. Mild aortic valve sclerosis is present, with no evidence of aortic valve stenosis.   Scheduled Meds: . sodium chloride   Intravenous Once  . atorvastatin  10  mg Oral Daily  . methadone  20 mg Oral Q12H  . sodium chloride flush  3 mL Intravenous Q12H    Continuous Infusions: . sodium chloride      PRN Meds: sodium chloride, acetaminophen **OR** acetaminophen, sodium chloride flush   IMPRESSION & RECOMMENDATIONS: Charles Blackwell is a 65 y.o. male whose past medical history and cardiac risk factors include:   Severe symptomatic anemia (hemoglobin on admission 4 g/dL status post 4 units of PRBC during his admission in March 2022), non-insulin-dependent diabetes mellitus type 2, hyperlipidemia, chronic back pain.  Impression Preprocedural risk stratification Dyspnea on exertion and chest pain: Most likely secondary to supply demand ischemia in the setting of severe symptomatic anemia Elevated BNP Incomplete left bundle branch block Severe symptomatic anemia (hemoglobin on admission 4 g/dL; status post 4 units of packed red blood cells) Non-insulin-dependent diabetes mellitus type 2 Hyperlipidemia Lumbago  Recommendations: Cardiology was requested to provide preprocedural risk stratification for upcoming colonoscopy and endoscopy.  Patient was informed that he is acceptable/low risk for the planned procedures.  He has undergone both colonoscopy and endoscopy earlier this morning and has done well.  Post procedure he does not complain of any chest pain or ACS symptoms.  Colonoscopy noted a 3 mm polyp in the proximal descending colon per report and EGD noted a nonbleeding gastric ulcer with no stigmata of bleeding likely the source of his anemia.  Patient no longer is having chest pain or  shortness of breath to suggest anginal equivalent.  From a cardiovascular standpoint would recommend treating his underlying anemia and then undergoing an ischemic evaluation given his other cardiovascular risk factors.  Patient [facility] to the [facility] for majority of his care and he is more than welcome to see a cardiologist there or to follow-up at our clinic based on his insurance.  Educated on improving his modifiable cardiovascular risk factors such as glycemic control, lipids, low-salt diet, increasing physical activity as tolerated.  I will have the office reach out to him to make a follow-up appointment; however, if he does not receive a phone call patient is advised to call the office for follow-up in 4 weeks.  Patient is thankful for the care provided during this hospitalization stay and his questions and concerns addressed to his satisfaction.  This note was created using a voice recognition software as a result there may be grammatical errors inadvertently enclosed that do not reflect the nature of this encounter. Every attempt is made to correct such errors.  Total time spent: 25 minutes  Zalman Hull [place], DO, [facility]. [place] Office: [phone] 06/22/2020, 1:00 PM

## 2020-06-22 NOTE — Anesthesia Preprocedure Evaluation (Addendum)
Anesthesia Evaluation  Patient identified by MRN, date of birth, ID band Patient awake    Reviewed: Allergy & Precautions, NPO status , Patient's Chart, lab work & pertinent test results  Airway Mallampati: II  TM Distance: >3 FB Neck ROM: Full    Dental  (+) Dental Advisory Given   Pulmonary neg pulmonary ROS,    Pulmonary exam normal breath sounds clear to auscultation       Cardiovascular + angina Normal cardiovascular exam+ dysrhythmias  Rhythm:Regular Rate:Normal  Echo 06/21/2020 1. Left ventricular ejection fraction, by estimation, is 55 to 60%. Left ventricular ejection fraction by 3D volume is 57 %. The left ventricle has normal function. The left ventricle has no regional wall motion abnormalities. Left ventricular diastolic parameters are consistent with Grade II diastolic dysfunction (pseudonormalization). Elevated left ventricular end-diastolic pressure.  2. Right ventricular systolic function is normal. The right ventricular size is normal.  3. Left atrial size was mildly dilated.  4. Right atrial size was severely dilated.  5. The mitral valve is normal in structure. No evidence of mitral valve regurgitation. No evidence of mitral stenosis.  6. The aortic valve is tricuspid. Aortic valve regurgitation is not visualized. Mild aortic valve sclerosis is present, with no evidence of aortic valve stenosis.    Neuro/Psych negative neurological ROS     GI/Hepatic Neg liver ROS, PUD,   Endo/Other  negative endocrine ROSdiabetes  Renal/GU negative Renal ROS     Musculoskeletal negative musculoskeletal ROS (+)   Abdominal   Peds  Hematology  (+) Blood dyscrasia, anemia ,   Anesthesia Other Findings   Reproductive/Obstetrics                            Anesthesia Physical Anesthesia Plan  ASA: III  Anesthesia Plan: MAC   Post-op Pain Management:    Induction: Intravenous  PONV  Risk Score and Plan: 1 and Propofol infusion, TIVA and Treatment may vary due to age or medical condition  Airway Management Planned: Natural Airway  Additional Equipment:   Intra-op Plan:   Post-operative Plan:   Informed Consent: I have reviewed the patients History and Physical, chart, labs and discussed the procedure including the risks, benefits and alternatives for the proposed anesthesia with the patient or authorized representative who has indicated his/her understanding and acceptance.     Dental advisory given  Plan Discussed with: CRNA  Anesthesia Plan Comments:        Anesthesia Quick Evaluation

## 2020-06-22 NOTE — Anesthesia Postprocedure Evaluation (Signed)
Anesthesia Post Note  Patient: Charles Blackwell  Procedure(s) Performed: COLONOSCOPY WITH PROPOFOL (N/A ) BIOPSY POLYPECTOMY ESOPHAGOGASTRODUODENOSCOPY (EGD) WITH PROPOFOL (N/A )     Patient location during evaluation: PACU Anesthesia Type: MAC Level of consciousness: awake and alert Pain management: pain level controlled Vital Signs Assessment: post-procedure vital signs reviewed and stable Respiratory status: spontaneous breathing Cardiovascular status: stable Anesthetic complications: no   No complications documented.  Last Vitals:  Vitals:   06/22/20 0920 06/22/20 0922  BP:  (!) 113/59  Pulse: 68 73  Resp: 12 18  Temp:    SpO2: 100% 97%    Last Pain:  Vitals:   06/22/20 0922  TempSrc:   PainSc: 0-No pain                 Nolon Nations

## 2020-06-22 NOTE — Interval H&P Note (Signed)
History and Physical Interval Note:  06/22/2020 8:17 AM  Charles Blackwell  has presented today for surgery, with the diagnosis of Anemia..  The various methods of treatment have been discussed with the patient and family. After consideration of risks, benefits and other options for treatment, the patient has consented to  Procedure(s): COLONOSCOPY WITH PROPOFOL (N/A) ESOPHAGOGASTRODUODENOSCOPY (EGD) WITH PROPOFOL (N/A) as a surgical intervention.  The patient's history has been reviewed, patient examined, no change in status, stable for surgery.  I have reviewed the patient's chart and labs.  Questions were answered to the patient's satisfaction.     Thornton Park

## 2020-06-22 NOTE — Op Note (Signed)
[facility] Patient Name: Charles Blackwell Procedure Date : 06/22/2020 MRN: [id number] Attending MD: Thornton Park MD, MD Date of Birth: 12-Jan-1956 CSN: [id number] Age: 65 Admit Type: Inpatient Procedure:                Colonoscopy Indications:              Unexplained iron deficiency anemia Providers:                Thornton Park MD, MD, Elmer Ramp. Tilden Dome, RN, Elspeth Cho Tech., Technician, Gala Lewandowsky, CRNA Referring MD:              Medicines:                Monitored Anesthesia Care Complications:            No immediate complications. Estimated blood loss:                            Minimal. Estimated Blood Loss:     Estimated blood loss was minimal. Procedure:                Pre-Anesthesia Assessment:                           - Prior to the procedure, a History and Physical                            was performed, and patient medications and                            allergies were reviewed. The patient's tolerance of                            previous anesthesia was also reviewed. The risks                            and benefits of the procedure and the sedation                            options and risks were discussed with the patient.                            All questions were answered, and informed consent                            was obtained. Prior Anticoagulants: The patient has                            taken no previous anticoagulant or antiplatelet                            agents. ASA Grade Assessment: III - A patient with  severe systemic disease. After reviewing the risks                            and benefits, the patient was deemed in                            satisfactory condition to undergo the procedure.                           After obtaining informed consent, the colonoscope                            was passed under direct vision. Throughout the                             procedure, the patient's blood pressure, pulse, and                            oxygen saturations were monitored continuously. The                            [id number]-HQ190L ([id number]) Olympus colonoscope was                            introduced through the anus and advanced to the 3                            cm into the ileum. The colonoscopy was performed                            without difficulty. The patient tolerated the                            procedure well. The quality of the bowel                            preparation was good. The terminal ileum, ileocecal                            valve, appendiceal orifice, and rectum were                            photographed. Scope In: 8:42:12 AM Scope Out: 8:55:36 AM Scope Withdrawal Time: 0 hours 11 minutes 6 seconds  Total Procedure Duration: 0 hours 13 minutes 24 seconds  Findings:      The perianal and digital rectal examinations were normal.      A 3 mm polyp was found in the proximal descending colon. The polyp was       sessile. The polyp was removed with a cold snare. Resection and       retrieval were complete. Estimated blood loss was minimal.      The exam was otherwise without abnormality on direct and retroflexion       views. Impression:               -  One 3 mm polyp in the proximal descending colon,                            removed with a cold snare. Resected and retrieved.                           - The examination was otherwise normal on direct                            and retroflexion views. Recommendation:           - Return patient to hospital ward for ongoing care.                           - Resume regular diet.                           - Continue present medications.                           - Await pathology results.                           - Repeat colonoscopy date to be determined after                            pending pathology results are reviewed for                             surveillance.                           - Emerging evidence supports eating a diet of                            fruits, vegetables, grains, calcium, and yogurt                            while reducing red meat and alcohol may reduce the                            risk of colon cancer.                           The inpatient GI team will move to stand-by. Please                            call with any additional questions or concerns                            during this hospitalization. Procedure Code(s):        --- Professional ---                           [phone], Colonoscopy, flexible; with removal of  tumor(s), polyp(s), or other lesion(s) by snare                            technique Diagnosis Code(s):        --- Professional ---                           K63.5, Polyp of colon                           D50.9, Iron deficiency anemia, unspecified CPT copyright 2019 [employer]. All rights reserved. The codes documented in this report are preliminary and upon coder review may  be revised to meet current compliance requirements. Thornton Park MD, MD 06/22/2020 9:26:19 AM This report has been signed electronically. Number of Addenda: 0

## 2020-06-22 NOTE — Discharge Summary (Signed)
Physician Discharge Summary  Charles Blackwell XIH:[id number] DOB: [birth year] DOA: 06/20/2020  PCP: No primary care provider on file.  Admit date: 06/20/2020 Discharge date: 06/22/2020  Time spent: 35 minutes  Recommendations for Outpatient Follow-up:  1. PCP 1 week, please monitor hemoglobin periodically 2. Gastroenterology Dr. Tarri Glenn in 1 month, please follow-up H. pylori studies 3. Cardiology Dr. Terri Skains or at the [facility]   Discharge Diagnoses:  Principal Problem: Severe iron deficiency anemia   Preprocedural cardiovascular examination   Precordial chest pain   Dyspnea on exertion   Type 2 diabetes mellitus without complication, without long-term current use of insulin (HCC)   Pure hypercholesterolemia   Symptomatic anemia   Incomplete right bundle branch block   Acute gastric ulcer with hemorrhage   Adenomatous polyp of descending colon   Discharge Condition: Stable  Diet recommendation: Diabetic, heart healthy  Filed Weights   06/20/20 1652 06/21/20 0500 06/22/20 0748  Weight: 96 kg 96.2 kg 96.2 kg    History of present illness:  Pleasant 65 year old male with history of diabetes mellitus and chronic low back pain for 30 years on methadone, first noticed being tired 3 to 4 months ago and he was started on testosterone/DHEA by his pain medicine doctor, 1 to 2 weeks ago noted exertional dyspnea and chest tightness, he went to [facility], noted to have a hemoglobin of 4, baseline unknown followed by the [place] [facility] [place], Hemoccult was negative, transfused 3 units of PRBC and transferred to [facility] for further evaluation.   Hospital Course:   Severe symptomatic iron deficiency anemia -Hemoglobin 4.0, upon arrival to [facility] ER, transfused 3 units of PRBC there and 1 unit here, hemoglobin now 7.4 -Anemia panel noted severe iron deficiency, no overt blood loss noted -seen by GI Dr.Beavers in consultation, underwent an endoscopy, EGD today which noted nonbleeding  gastric ulcer which is suspected to be the source of his anemia, this was biopsied,  colonoscopy earlier this morning which noted a 3 mm polyp in the proximal descending colon which was removed -Continue PPI twice daily, advised regarding avoiding NSAIDs, alcohol etc. -Given IV iron today prior to discharge and started on oral iron twice daily -Follow-up with gastroenterology in 1 month, plan to repeat endoscopy and follow-up H. pylori studies  Dyspnea on exertion Possible angina -Secondary to severe anemia, EKG without acute ST-T wave changes and high-sensitivity troponins are negative -2D echo was completed and normal -Seen by Dr. Terri Skains in consultation who recommended outpatient follow-up at the [facility] or [place] [facility] in 1 to 2 months  Type 2 diabetes mellitus -Glipizide and Metformin resumed  Chronic pain -Continue methadone per home regimen   Discharge Exam: Vitals:   06/22/20 0920 06/22/20 0922  BP:  (!) 113/59  Pulse: 68 73  Resp: 12 18  Temp:    SpO2: 100% 97%    General: AAOx3 Cardiovascular:S1S2/RRR Respiratory: CTAB  Discharge Instructions   Discharge Instructions    Diet - low sodium heart healthy   Complete by: As directed    Diet Carb Modified   Complete by: As directed    Increase activity slowly   Complete by: As directed      Allergies as of 06/22/2020   No Known Allergies     Medication List    TAKE these medications   atorvastatin 10 MG tablet Commonly known as: LIPITOR Take 10 mg by mouth daily.   DHEA 10 MG Tabs Take 1 tablet by mouth daily.   ferrous sulfate  325 (65 FE) MG tablet Take 1 tablet (325 mg total) by mouth 2 (two) times daily with a meal.   glipiZIDE 5 MG tablet Commonly known as: GLUCOTROL Take 5 mg by mouth daily before breakfast.   metFORMIN 500 MG tablet Commonly known as: GLUCOPHAGE Take 1 tablet by mouth 2 (two) times daily.   methadone 10 MG tablet Commonly known as: DOLOPHINE Take 20 mg by mouth every  12 (twelve) hours.   pantoprazole 40 MG tablet Commonly known as: Protonix Take 1 tablet (40 mg total) by mouth 2 (two) times daily. Take 40mg  Po BID for 4weeks and then Once daily   testosterone cypionate 200 MG/ML injection Commonly known as: DEPOTESTOSTERONE CYPIONATE Inject 200 mg into the muscle every 14 (fourteen) days.   VITAMIN A PO Take 1 tablet by mouth daily.   vitamin C 500 MG tablet Commonly known as: ASCORBIC ACID Take 500 mg by mouth daily.   Vitamin D-3 125 MCG (5000 UT) Tabs Take 1 tablet by mouth in the morning and at bedtime.   ZINC PO Take 1 tablet by mouth daily.      No Known Allergies  Follow-up Information    PCP. Schedule an appointment as soon as possible for a visit in 2 week(s).        Thornton Park, MD. Go in 1 month(s).   Specialty: Gastroenterology Contact information: [address] [address] [phone]        Cardiology Follow up.   [facility]               The results of significant diagnostics from this hospitalization (including imaging, microbiology, ancillary and laboratory) are listed below for reference.    Significant Diagnostic Studies: ECHOCARDIOGRAM COMPLETE  Result Date: 06/21/2020    ECHOCARDIOGRAM REPORT   Patient Name:   Charles Blackwell Date of Exam: 06/21/2020 Medical Rec #:  [id number]    Height:       71.0 in Accession #:    [id number]   Weight:       212.1 lb Date of Birth:  [birth year]     BSA:          2.162 m Patient Age:    27 years     BP:           116/60 mmHg Patient Gender: M            HR:           82 bpm. Exam Location:  Inpatient Procedure: 2D Echo, Color Doppler and Cardiac Doppler Indications:    Acute ischemic heart disease I24.9  History:        Patient has no prior history of Echocardiogram examinations.                 Signs/Symptoms:Dyspnea. Anemia.  Sonographer:    Merrie Roof RDCS Referring Phys: [id number] [place]  1. Left ventricular ejection fraction, by estimation, is 55 to 60%. Left ventricular ejection fraction by 3D volume is 57 %. The left ventricle has normal function. The left ventricle has no regional wall motion abnormalities. Left ventricular diastolic  parameters are consistent with Grade II diastolic dysfunction (pseudonormalization). Elevated left ventricular end-diastolic pressure.  2. Right ventricular systolic function is normal. The right ventricular size is normal.  3. Left atrial size was mildly dilated.  4. Right atrial size was severely dilated.  5. The mitral valve is normal  in structure. No evidence of mitral valve regurgitation. No evidence of mitral stenosis.  6. The aortic valve is tricuspid. Aortic valve regurgitation is not visualized. Mild aortic valve sclerosis is present, with no evidence of aortic valve stenosis. FINDINGS  Left Ventricle: Left ventricular ejection fraction, by estimation, is 55 to 60%. Left ventricular ejection fraction by 3D volume is 57 %. The left ventricle has normal function. The left ventricle has no regional wall motion abnormalities. The left ventricular internal cavity size was normal in size. There is no left ventricular hypertrophy. Left ventricular diastolic parameters are consistent with Grade II diastolic dysfunction (pseudonormalization). Elevated left ventricular end-diastolic pressure. Right Ventricle: The right ventricular size is normal. No increase in right ventricular wall thickness. Right ventricular systolic function is normal. Left Atrium: Left atrial size was mildly dilated. Right Atrium: Right atrial size was severely dilated. Pericardium: There is no evidence of pericardial effusion. Mitral Valve: The mitral valve is normal in structure. No evidence of mitral valve regurgitation. No evidence of mitral valve stenosis. Tricuspid Valve: The tricuspid valve is normal in structure. Tricuspid valve regurgitation is mild . No evidence of  tricuspid stenosis. Aortic Valve: The aortic valve is tricuspid. Aortic valve regurgitation is not visualized. Mild aortic valve sclerosis is present, with no evidence of aortic valve stenosis. Aortic valve mean gradient measures 7.0 mmHg. Aortic valve peak gradient measures 14.3 mmHg. Aortic valve area, by VTI measures 2.58 cm. Pulmonic Valve: The pulmonic valve was normal in structure. Pulmonic valve regurgitation is not visualized. No evidence of pulmonic stenosis. Aorta: The aortic root is normal in size and structure. IAS/Shunts: No atrial level shunt detected by color flow Doppler.  LEFT VENTRICLE PLAX 2D LVIDd:         5.70 cm         Diastology LVIDs:         3.90 cm         LV e' medial:    8.70 cm/s LV PW:         1.00 cm         LV E/e' medial:  14.8 LV IVS:        1.00 cm         LV e' lateral:   11.50 cm/s LVOT diam:     2.10 cm         LV E/e' lateral: 11.2 LV SV:         101 LV SV Index:   47 LVOT Area:     3.46 cm        3D Volume EF                                LV 3D EF:    Left                                             ventricular                                             ejection  fraction by                                             3D volume                                             is 57 %.                                 3D Volume EF:                                3D EF:        57 %                                LV EDV:       179 ml                                LV ESV:       77 ml                                LV SV:        102 ml RIGHT VENTRICLE RV Basal diam:  3.70 cm LEFT ATRIUM             Index       RIGHT ATRIUM           Index LA diam:        3.60 cm 1.67 cm/m  RA Area:     30.30 cm LA Vol (A2C):   75.3 ml 34.83 ml/m RA Volume:   118.00 ml 54.58 ml/m LA Vol (A4C):   71.0 ml 32.84 ml/m LA Biplane Vol: 74.7 ml 34.55 ml/m  AORTIC VALVE AV Area (Vmax):    2.68 cm AV Area (Vmean):   2.83 cm AV Area (VTI):     2.58 cm AV  Vmax:           189.00 cm/s AV Vmean:          125.000 cm/s AV VTI:            0.393 m AV Peak Grad:      14.3 mmHg AV Mean Grad:      7.0 mmHg LVOT Vmax:         146.00 cm/s LVOT Vmean:        102.000 cm/s LVOT VTI:          0.293 m LVOT/AV VTI ratio: 0.75  AORTA Ao Root diam: 3.20 cm Ao Asc diam:  3.00 cm MITRAL VALVE                TRICUSPID VALVE MV Area (PHT): 3.11 cm     TR Peak grad:   39.4 mmHg MV Decel Time: 244 msec     TR Vmax:        314.00 cm/s MV E velocity: 129.00 cm/s MV A velocity: 86.10  cm/s   SHUNTS MV E/A ratio:  1.50         Systemic VTI:  0.29 m                             Systemic Diam: 2.10 cm Fransico Him MD Electronically signed by Fransico Him MD Signature Date/Time: 06/21/2020/2:06:26 PM    Final     Microbiology: Recent Results (from the past 240 hour(s))  Surgical PCR screen     Status: None   Collection Time: 06/21/20  9:45 AM   Specimen: Nasal Mucosa; Nasal Swab  Result Value Ref Range Status   MRSA, PCR NEGATIVE NEGATIVE Final   Staphylococcus aureus NEGATIVE NEGATIVE Final    Comment: (NOTE) The Xpert SA Assay (FDA approved for NASAL specimens in patients 63 years of age and older), is one component of a comprehensive surveillance program. It is not intended to diagnose infection nor to guide or monitor treatment. Performed at [facility], [place] [address], [place], [place] [address]      Labs: Basic Metabolic Panel: Recent Labs  Lab 06/20/20 1829 06/21/20 0335 06/22/20 0210  NA 134* 135 136  K 3.7 3.8 3.9  CL 106 109 107  CO2 24 22 24   GLUCOSE 136* 96 109*  BUN 12 12 9   CREATININE 0.78 0.72 0.77  CALCIUM 7.8* 7.5* 7.9*  MG 1.9  --  1.8   Liver Function Tests: Recent Labs  Lab 06/20/20 1829 06/22/20 0210  AST 24 25  ALT 25 21  ALKPHOS 82 76  BILITOT 0.8 1.3*  PROT 5.8* 5.3*  ALBUMIN 2.6* 2.4*   No results for input(s): LIPASE, AMYLASE in the last 168 hours. No results for input(s): AMMONIA in the last 168 hours. CBC: Recent  Labs  Lab 06/20/20 1829 06/21/20 0335 06/22/20 0210  WBC 6.3 5.9 4.8  HGB 6.7* 7.4* 7.3*  HCT 21.8* 23.5* 22.8*  MCV 79.0* 79.9* 79.7*  PLT 154 126* 130*   Cardiac Enzymes: No results for input(s): CKTOTAL, CKMB, CKMBINDEX, TROPONINI in the last 168 hours. BNP: BNP (last 3 results) Recent Labs    06/20/20 1829  BNP 168.1*    ProBNP (last 3 results) No results for input(s): PROBNP in the last 8760 hours.  CBG: No results for input(s): GLUCAP in the last 168 hours.  Signed:  Domenic Polite MD.  [facility] 06/22/2020, 11:41 AM

## 2020-06-22 NOTE — Op Note (Addendum)
[facility] Patient Name: Charles Blackwell Procedure Date : 06/22/2020 MRN: [id number] Attending MD: Thornton Park MD, MD Date of Birth: 27-Oct-1955 CSN: [id number] Age: 65 Admit Type: Inpatient Procedure:                Upper GI endoscopy Indications:              Unexplained iron deficiency anemia Providers:                Thornton Park MD, MD, Tory Emerald, RN, Elspeth Cho Tech., Technician, Gala Lewandowsky, CRNA Referring MD:              Medicines:                Monitored Anesthesia Care Complications:            No immediate complications. Estimated blood loss:                            Minimal. Estimated Blood Loss:     Estimated blood loss was minimal. Procedure:                Pre-Anesthesia Assessment:                           - Prior to the procedure, a History and Physical                            was performed, and patient medications and                            allergies were reviewed. The patient's tolerance of                            previous anesthesia was also reviewed. The risks                            and benefits of the procedure and the sedation                            options and risks were discussed with the patient.                            All questions were answered, and informed consent                            was obtained. Prior Anticoagulants: The patient has                            taken no previous anticoagulant or antiplatelet                            agents. ASA Grade Assessment: III - A patient with  severe systemic disease. After reviewing the risks                            and benefits, the patient was deemed in                            satisfactory condition to undergo the procedure.                           After obtaining informed consent, the endoscope was                            passed under direct vision. Throughout the                             procedure, the patient's blood pressure, pulse, and                            oxygen saturations were monitored continuously. The                            GIF-H190 ([id number]) Olympus gastroscope was                            introduced through the mouth, and advanced to the                            third part of duodenum. The upper GI endoscopy was                            accomplished without difficulty. The patient                            tolerated the procedure well. Scope In: Scope Out: Findings:      The examined esophagus showed LA Class A reflux esophagitis.      One non-bleeding cratered gastric ulcer with no stigmata of bleeding was       found in the prepyloric region of the stomach. The lesion was 5 mm in       largest dimension. Biopsies were taken from the ulcer margin, antrum,       body, and fundus with a cold forceps for histology. Estimated blood loss       was minimal.      The examined duodenum was normal.      No evidence for active or recent bleeding. The exam was otherwise       without abnormality. Impression:               - LA Class A reflux esophagitis.                           - Non-bleeding gastric ulcer with no stigmata of                            bleeding. The likely source of anemia. Biopsied.                           -  Normal examined duodenum.                           - The examination was otherwise normal. Recommendation:           - Return patient to hospital ward for ongoing care.                           - Resume regular diet.                           - Continue present medications. Pantoprazole 40 mg                            BID x 12 weeks.                           - No aspirin, ibuprofen, naproxen, or other                            non-steroidal anti-inflammatory drugs.                           - Await pathology results.                           - Repeat upper endoscopy in 12 weeks to evaluate                             the response to therapy.                           - Consider small bowel evaluation if anemia persist                            despite resolution of gastric ulcer.                           The inpatient GI team will move to stand-by. Please                            call the on-call gastroenterologist with any                            additional questions or concerns during this                            hospitalization. Procedure Code(s):        --- Professional ---                           [phone], Esophagogastroduodenoscopy, flexible,                            transoral; with biopsy, single or multiple Diagnosis Code(s):        --- Professional ---  K25.9, Gastric ulcer, unspecified as acute or                            chronic, without hemorrhage or perforation                           D50.9, Iron deficiency anemia, unspecified CPT copyright 2019 [employer]. All rights reserved. The codes documented in this report are preliminary and upon coder review may  be revised to meet current compliance requirements. Thornton Park MD, MD 06/22/2020 9:08:57 AM This report has been signed electronically. Number of Addenda: 0

## 2020-06-23 LAB — GLUCOSE, CAPILLARY
Glucose-Capillary: 74 mg/dL (ref 70–99)
Glucose-Capillary: 91 mg/dL (ref 70–99)

## 2020-06-24 LAB — SURGICAL PATHOLOGY

## 2020-06-25 ENCOUNTER — Telehealth: Payer: Self-pay | Admitting: Gastroenterology

## 2020-06-25 NOTE — Telephone Encounter (Signed)
Hi Dr. Tarri Glenn, this pt had a procedure with you recently at [facility] hospital and he was instructed to schedule a f/u appt with you. However, he is established with [facility]. Pt would like to transfer his care over to you. Will you accept pt? Thank you.

## 2020-06-27 ENCOUNTER — Encounter: Payer: Self-pay | Admitting: Gastroenterology

## 2020-06-27 NOTE — Telephone Encounter (Signed)
Consult sch on 5/5 at 8:30am.

## 2020-06-27 NOTE — Telephone Encounter (Signed)
OK with me.

## 2020-07-24 NOTE — Progress Notes (Signed)
Date:  07/25/2020   ID:  Charles Blackwell, DOB 1955/09/26, MRN [id number]  PCP:  No primary care provider on file.  Cardiologist: Rex Kras, DO, [facility] (established care 07/24/2020)  Date: 07/25/20 Last Office Visit: 06/21/2020  Chief Complaint  Patient presents with  . Chest Pain  . Hospitalization Follow-up  . Anemia    HPI  Charles Blackwell is a 65 y.o. male who presents to the office with a chief complaint of " hospital follow-up, reevaluation of chest pain." Patient's past medical history and cardiovascular risk factors include: Incomplete right bundle branch block, non-insulin-dependent diabetes mellitus type 2, chronic lumbago, hyperlipidemia, recent history gastrointestinal bleeding due to gastric ulcer and severe iron deficiency anemia status post PRBC (March 2022), advanced age.  Patient presents for post hospital follow-up and to establish care for evaluation and management of chest pain.  Patient was recently hospitalized in March 2022 and was found to have severe symptomatic anemia with a hemoglobin of approximately 4 g/dL.  He underwent transfusion with packed red blood cells until his hemoglobin stabilized and underwent endoscopy and was noted to have a bleeding gastric ulcer.  In the interim, during the hospitalization cardiology was consulted because when he presented to the ER he was complaining of chest pain and shortness of breath.  At that time patient's care team was informed that his chest pain and shortness of breath is most likely secondary to supply demand ischemia given the severe symptomatic anemia on presentation.  In addition, despite the severe symptomatic anemia his high sensitive troponins were within normal limits.  Patient underwent an echocardiogram results noted below for further reference.  He was asked to follow-up as outpatient for an ischemic evaluation once his anemia is resolved.  Since discharge he states that he is doing well and slowly getting back to  baseline.  He denies any chest pain or shortness of breath at rest or with effort related activities.  He denies any evidence of bleeding.  Patient states that he has a follow-up appointment with gastroenterology coming up.  FUNCTIONAL STATUS: No structured exercise program or daily routine.   ALLERGIES: No Known Allergies  MEDICATION LIST PRIOR TO VISIT: Current Meds  Medication Sig  . atorvastatin (LIPITOR) 10 MG tablet Take 10 mg by mouth daily.  . Cholecalciferol (VITAMIN D-3) 125 MCG (5000 UT) TABS Take 1 tablet by mouth in the morning and at bedtime.  Marland Kitchen DHEA 10 MG TABS Take 1 tablet by mouth daily.  . ferrous sulfate 325 (65 FE) MG tablet Take 1 tablet (325 mg total) by mouth 2 (two) times daily with a meal.  . glipiZIDE (GLUCOTROL) 5 MG tablet Take 5 mg by mouth daily before breakfast.  . metFORMIN (GLUCOPHAGE) 500 MG tablet Take 1 tablet by mouth 2 (two) times daily.  . methadone (DOLOPHINE) 10 MG tablet Take 20 mg by mouth every 12 (twelve) hours.  . Multiple Vitamins-Minerals (ZINC PO) Take 1 tablet by mouth daily.  . pantoprazole (PROTONIX) 40 MG tablet Take 1 tablet (40 mg total) by mouth 2 (two) times daily. Take 40mg  Po BID for 4weeks and then Once daily  . testosterone cypionate (DEPOTESTOSTERONE CYPIONATE) 200 MG/ML injection Inject 200 mg into the muscle every 14 (fourteen) days.  Marland Kitchen VITAMIN A PO Take 1 tablet by mouth daily.  . vitamin C (ASCORBIC ACID) 500 MG tablet Take 500 mg by mouth daily.     PAST MEDICAL HISTORY: Past Medical History:  Diagnosis Date  . Chronic back  pain   . Diabetes ([place])   . GI bleeding 06/2020  . Hyperlipidemia     PAST SURGICAL HISTORY: Past Surgical History:  Procedure Laterality Date  . BACK SURGERY     X 4, rods and pins, took bone out of hip and put in back, removal of rods   . BIOPSY  06/22/2020   Procedure: BIOPSY;  Surgeon: Thornton Park, MD;  Location: [place];  Service: Gastroenterology;;  . COLONOSCOPY WITH  PROPOFOL N/A 06/22/2020   Procedure: COLONOSCOPY WITH PROPOFOL;  Surgeon: Thornton Park, MD;  Location: [place];  Service: Gastroenterology;  Laterality: N/A;  . ESOPHAGOGASTRODUODENOSCOPY (EGD) WITH PROPOFOL N/A 06/22/2020   Procedure: ESOPHAGOGASTRODUODENOSCOPY (EGD) WITH PROPOFOL;  Surgeon: Thornton Park, MD;  Location: [place];  Service: Gastroenterology;  Laterality: N/A;  . POLYPECTOMY  06/22/2020   Procedure: POLYPECTOMY;  Surgeon: Thornton Park, MD;  Location: [facility] ENDOSCOPY;  Service: Gastroenterology;;    FAMILY HISTORY: The patient family history includes Cancer in his mother.  SOCIAL HISTORY:  The patient  reports that he has never smoked. He has never used smokeless tobacco. He reports previous drug use. He reports that he does not drink alcohol.  REVIEW OF SYSTEMS: Review of Systems  Constitutional: Negative for chills and fever.  HENT: Negative for hoarse voice and nosebleeds.   Eyes: Negative for discharge, double vision and pain.  Cardiovascular: Negative for chest pain, claudication, dyspnea on exertion, leg swelling, near-syncope, orthopnea, palpitations, paroxysmal nocturnal dyspnea and syncope.  Respiratory: Negative for hemoptysis and shortness of breath.   Musculoskeletal: Negative for muscle cramps and myalgias.  Gastrointestinal: Negative for abdominal pain, constipation, diarrhea, hematemesis, hematochezia, melena, nausea and vomiting.  Neurological: Negative for dizziness and light-headedness.    PHYSICAL EXAM: Vitals with BMI 07/25/2020 06/22/2020 06/22/2020  Height 5\' 11"  - -  Weight 200 lbs 10 oz - -  BMI 26.37 - -  Systolic 858 850 -  Diastolic 68 59 -  Pulse 72 73 68    CONSTITUTIONAL: Well-developed and well-nourished. No acute distress.  SKIN: Skin is warm and dry. No rash noted. No cyanosis. No pallor. No jaundice HEAD: Normocephalic and atraumatic.  EYES: No scleral icterus MOUTH/THROAT: Moist oral membranes.  NECK: No JVD  present. No thyromegaly noted. No carotid bruits  LYMPHATIC: No visible cervical adenopathy.  CHEST Normal respiratory effort. No intercostal retractions  LUNGS: Clear to auscultation bilaterally.  No stridor. No wheezes. No rales.  CARDIOVASCULAR: Regular rate and rhythm, positive S1-S2, no murmurs rubs or gallops appreciated ABDOMINAL: No apparent ascites.  EXTREMITIES: No peripheral edema  HEMATOLOGIC: No significant bruising NEUROLOGIC: Oriented to person, place, and time. Nonfocal. Normal muscle tone.  PSYCHIATRIC: Normal mood and affect. Normal behavior. Cooperative  CARDIAC DATABASE: EKG: 07/25/2020: Sinus  Rhythm, 66bpm, IRBBB, without underlying injury pattern.   Echocardiogram: 06/21/2020: LVEF 55 to 60%, no regional wall motion abnormality, grade 2 diastolic impairment, mild left atrial dilatation, severely dilated right atrium, aortic valve sclerosis without stenosis.  Stress Testing: No results found for this or any previous visit from the past 1095 days.   Heart Catheterization: None  LABORATORY DATA: CBC Latest Ref Rng & Units 06/22/2020 06/21/2020 06/20/2020  WBC 4.0 - 10.5 K/uL 4.8 5.9 6.3  Hemoglobin 13.0 - 17.0 g/dL 7.3(L) 7.4(L) 6.7(LL)  Hematocrit 39.0 - 52.0 % 22.8(L) 23.5(L) 21.8(L)  Platelets 150 - 400 K/uL 130(L) 126(L) 154    CMP Latest Ref Rng & Units 06/22/2020 06/21/2020 06/20/2020  Glucose 70 - 99 mg/dL 109(H) 96 136(H)  BUN 8 - 23 mg/dL 9 12 12   Creatinine 0.61 - 1.24 mg/dL 0.77 0.72 0.78  Sodium 135 - 145 mmol/L 136 135 134(L)  Potassium 3.5 - 5.1 mmol/L 3.9 3.8 3.7  Chloride 98 - 111 mmol/L 107 109 106  CO2 22 - 32 mmol/L 24 22 24   Calcium 8.9 - 10.3 mg/dL 7.9(L) 7.5(L) 7.8(L)  Total Protein 6.5 - 8.1 g/dL 5.3(L) - 5.8(L)  Total Bilirubin 0.3 - 1.2 mg/dL 1.3(H) - 0.8  Alkaline Phos 38 - 126 U/L 76 - 82  AST 15 - 41 U/L 25 - 24  ALT 0 - 44 U/L 21 - 25    Lipid Panel  No results found for: CHOL, TRIG, HDL, CHOLHDL, VLDL, LDLCALC, LDLDIRECT,  LABVLDL  No components found for: NTPROBNP No results for input(s): PROBNP in the last 8760 hours. Recent Labs    06/20/20 1829  TSH 2.121    BMP Recent Labs    06/20/20 1829 06/21/20 0335 06/22/20 0210  NA 134* 135 136  K 3.7 3.8 3.9  CL 106 109 107  CO2 24 22 24   GLUCOSE 136* 96 109*  BUN 12 12 9   CREATININE 0.78 0.72 0.77  CALCIUM 7.8* 7.5* 7.9*  GFRNONAA >60 >60 >60    HEMOGLOBIN A1C No results found for: HGBA1C, MPG  IMPRESSION:    ICD-10-CM   1. Precordial pain  R07.2 EKG 12-Lead    PCV MYOCARDIAL PERFUSION WO LEXISCAN    SARS-COV-2 RNA,(COVID-19) QUAL NAAT  2. Dyspnea on exertion  R06.00 PCV MYOCARDIAL PERFUSION WO LEXISCAN    SARS-COV-2 RNA,(COVID-19) QUAL NAAT  3. Type 2 diabetes mellitus without complication, without long-term current use of insulin (HCC)  E11.9 PCV MYOCARDIAL PERFUSION WO LEXISCAN    SARS-COV-2 RNA,(COVID-19) QUAL NAAT  4. Hx of symptomatic anemia  D64.9   5. Incomplete right bundle branch block  I45.10   6. Pure hypercholesterolemia  E78.00      RECOMMENDATIONS: SAQIB CAZAREZ is a 65 y.o. male whose past medical history and cardiac risk factors include:  Incomplete right bundle branch block, non-insulin-dependent diabetes mellitus type 2, chronic lumbago, hyperlipidemia, recent history gastrointestinal bleeding due to gastric ulcer and severe iron deficiency anemia status post PRBC (March 2022), advanced age.  Precordial pain: Patient presented to the hospital in March 2022 with chest pain and shortness of breath found to be secondary to supply demand ischemia in the setting of severe symptomatic anemia.  His high sensitive troponins were negative x2.  He did well from a cardiovascular standpoint. Patient is currently chest pain-free and no limitation in his physical exertion. However given his multiple cardiovascular risk factors including diabetes, hyperlipidemia, advanced age the plan was to proceed with ischemic evaluation. We will  order a exercise nuclear stress test to evaluate for functional status and reversible ischemia. Continue current medical therapy.  Type 2 diabetes mellitus, non-insulin-dependent: Educated on importance of glycemic control.  Currently managed by primary care provider.  History of severe symptomatic anemia:  Secondary to upper GI bleed.  Requiring 4 units of packed red blood cells.  Has a follow-up with GI in the upcoming weeks.  Recommend ischemic evaluation once his GI bleeding is resolved.  Patient is informed that underlying anemia can lead to false positive stress test and therefore I will postpone the stress test couple weeks before the next follow-up visit.   FINAL MEDICATION LIST END OF ENCOUNTER: No orders of the defined types were placed in this encounter.   There are  no discontinued medications.   Current Outpatient Medications:  .  atorvastatin (LIPITOR) 10 MG tablet, Take 10 mg by mouth daily., Disp: , Rfl:  .  Cholecalciferol (VITAMIN D-3) 125 MCG (5000 UT) TABS, Take 1 tablet by mouth in the morning and at bedtime., Disp: , Rfl:  .  DHEA 10 MG TABS, Take 1 tablet by mouth daily., Disp: , Rfl:  .  ferrous sulfate 325 (65 FE) MG tablet, Take 1 tablet (325 mg total) by mouth 2 (two) times daily with a meal., Disp: 60 tablet, Rfl: 2 .  glipiZIDE (GLUCOTROL) 5 MG tablet, Take 5 mg by mouth daily before breakfast., Disp: , Rfl:  .  metFORMIN (GLUCOPHAGE) 500 MG tablet, Take 1 tablet by mouth 2 (two) times daily., Disp: , Rfl:  .  methadone (DOLOPHINE) 10 MG tablet, Take 20 mg by mouth every 12 (twelve) hours., Disp: , Rfl:  .  Multiple Vitamins-Minerals (ZINC PO), Take 1 tablet by mouth daily., Disp: , Rfl:  .  pantoprazole (PROTONIX) 40 MG tablet, Take 1 tablet (40 mg total) by mouth 2 (two) times daily. Take 40mg  Po BID for 4weeks and then Once daily, Disp: 60 tablet, Rfl: 1 .  testosterone cypionate (DEPOTESTOSTERONE CYPIONATE) 200 MG/ML injection, Inject 200 mg into the  muscle every 14 (fourteen) days., Disp: , Rfl:  .  VITAMIN A PO, Take 1 tablet by mouth daily., Disp: , Rfl:  .  vitamin C (ASCORBIC ACID) 500 MG tablet, Take 500 mg by mouth daily., Disp: , Rfl:   Orders Placed This Encounter  Procedures  . SARS-COV-2 RNA,(COVID-19) QUAL NAAT  . PCV MYOCARDIAL PERFUSION WO LEXISCAN  . EKG 12-Lead    There are no Patient Instructions on file for this visit.   --Continue cardiac medications as reconciled in final medication list. --Return in about 6 months (around 01/24/2021) for Follow up, Chest pain, Review test results. Or sooner if needed. --Continue follow-up with your primary care physician regarding the management of your other chronic comorbid conditions.  Patient's questions and concerns were addressed to his satisfaction. He voices understanding of the instructions provided during this encounter.   This note was created using a voice recognition software as a result there may be grammatical errors inadvertently enclosed that do not reflect the nature of this encounter. Every attempt is made to correct such errors.  Rex Kras, [place], [facility]  Pager: [phone] Office: [phone]

## 2020-07-25 ENCOUNTER — Other Ambulatory Visit: Payer: Self-pay

## 2020-07-25 ENCOUNTER — Encounter: Payer: Self-pay | Admitting: Cardiology

## 2020-07-25 ENCOUNTER — Ambulatory Visit: Payer: Medicare Other | Admitting: Cardiology

## 2020-07-25 VITALS — BP 123/68 | HR 72 | Temp 99.1°F | Resp 16 | Ht 71.0 in | Wt 200.6 lb

## 2020-07-25 DIAGNOSIS — R0609 Other forms of dyspnea: Secondary | ICD-10-CM

## 2020-07-25 DIAGNOSIS — E7800 Pure hypercholesterolemia, unspecified: Secondary | ICD-10-CM

## 2020-07-25 DIAGNOSIS — I4510 Unspecified right bundle-branch block: Secondary | ICD-10-CM

## 2020-07-25 DIAGNOSIS — R0600 Dyspnea, unspecified: Secondary | ICD-10-CM

## 2020-07-25 DIAGNOSIS — E119 Type 2 diabetes mellitus without complications: Secondary | ICD-10-CM

## 2020-07-25 DIAGNOSIS — D649 Anemia, unspecified: Secondary | ICD-10-CM

## 2020-07-25 DIAGNOSIS — R072 Precordial pain: Secondary | ICD-10-CM

## 2020-08-06 NOTE — Progress Notes (Signed)
Referring Provider: No ref. provider found Primary Care Physician:  Caryl Bis, MD  Chief complaint: Hospital follow-up   IMPRESSION:  H. pylori negative gastric ulcer with associated bleeding    - source of ulcer unclear at this time GI blood loss anemia due to the gastric ulcer    - labs today to follow-up on his anemia    - continue oral iron supplements at this time History of tubular adenoma    - surveillance colonoscopy in 7 years Borderline thrombocytopenia  PLAN: - CBC to follow-up on the anemia and thrombocytopenia - Continue ferrous sulfate 325 mg changing the frequency to QOD - Reduce pantoprazole to 40 mg QAM - EGD to follow-up on the ulcer and insure healing - Surveillance colonoscopy in 7 years given the history of tubular adenoma  Please see the "Patient Instructions" section for addition details about the plan.  HPI: Charles Blackwell is a 65 y.o. male seen in hospital follow-up.  He was hospitalized in March for symptomatic iron deficiency anemia with a hemoglobin of 4, Iron 11, ferritin 6, saturation ratio 3, platelets 126.  There has been no overt GI blood loss.  He was previously evaluated by [facility] and had had colon cancer screening performed with stool cards.   EGD and colonoscopy were performed 06/22/2020. - EGD revealed LA class a reflux esophagitis, a nonbleeding 5 mm prepyloric gastric ulcer. Gastric biopsies showed gastropathy.  There was no H. Pylori. - Colonoscopy revealed a 3 mm proximal descending colon tubular adenoma.  Labs on discharge summary hemoglobin of 7.3, RDW 18.6, MCV 79.7, platelets 130.  He returns today feeling well. No GI complaints at this time. No labs checked since he was hospitalized.   He stopped using NSAIDs in July 2021, several months prior to his hospitalization, at the recommendation [facility] when his blood counts were found to be low.    No known family history of colon cancer or polyps. No family history of  uterine/endometrial cancer, pancreatic cancer or gastric/stomach cancer.   Past Medical History:  Diagnosis Date  . Anemia   . Chronic back pain   . Diabetes ([place])   . GI bleeding 06/2020  . Hyperlipidemia   . Peptic ulcer     Past Surgical History:  Procedure Laterality Date  . BACK SURGERY     X 4, rods and pins, took bone out of hip and put in back, removal of rods   . BIOPSY  06/22/2020   Procedure: BIOPSY;  Surgeon: Thornton Park, MD;  Location: [place];  Service: Gastroenterology;;  . COLONOSCOPY WITH PROPOFOL N/A 06/22/2020   Procedure: COLONOSCOPY WITH PROPOFOL;  Surgeon: Thornton Park, MD;  Location: [place];  Service: Gastroenterology;  Laterality: N/A;  . ESOPHAGOGASTRODUODENOSCOPY (EGD) WITH PROPOFOL N/A 06/22/2020   Procedure: ESOPHAGOGASTRODUODENOSCOPY (EGD) WITH PROPOFOL;  Surgeon: Thornton Park, MD;  Location: [place];  Service: Gastroenterology;  Laterality: N/A;  . POLYPECTOMY  06/22/2020   Procedure: POLYPECTOMY;  Surgeon: Thornton Park, MD;  Location: [facility] ENDOSCOPY;  Service: Gastroenterology;;    Current Outpatient Medications  Medication Sig Dispense Refill  . atorvastatin (LIPITOR) 10 MG tablet Take 10 mg by mouth daily.    . Cholecalciferol (VITAMIN D-3) 125 MCG (5000 UT) TABS Take 1 tablet by mouth in the morning and at bedtime.    Marland Kitchen DHEA 10 MG TABS Take 1 tablet by mouth daily.    . ferrous sulfate 325 (65 FE) MG tablet Take 1 tablet (325  mg total) by mouth 2 (two) times daily with a meal. 60 tablet 2  . glipiZIDE (GLUCOTROL) 5 MG tablet Take 5 mg by mouth daily before breakfast.    . metFORMIN (GLUCOPHAGE) 500 MG tablet Take 1 tablet by mouth 2 (two) times daily.    . methadone (DOLOPHINE) 10 MG tablet Take 20 mg by mouth every 12 (twelve) hours.    . Multiple Vitamins-Minerals (ZINC PO) Take 1 tablet by mouth daily.    . pantoprazole (PROTONIX) 40 MG tablet Take 1 tablet (40 mg total) by mouth 2 (two) times daily. Take 40mg  Po  BID for 4weeks and then Once daily 60 tablet 1  . testosterone cypionate (DEPOTESTOSTERONE CYPIONATE) 200 MG/ML injection Inject 200 mg into the muscle every 14 (fourteen) days.    Marland Kitchen VITAMIN A PO Take 1 tablet by mouth daily.    . vitamin C (ASCORBIC ACID) 500 MG tablet Take 500 mg by mouth daily.     No current facility-administered medications for this visit.    Allergies as of 08/07/2020  . (No Known Allergies)    Family History  Problem Relation Age of Onset  . Cancer Mother        unknown kind of cancer  . Heart disease Paternal Uncle   . Colon cancer Neg Hx   . Colon polyps Neg Hx       Physical Exam: General:   Alert,  well-nourished, pleasant and cooperative in NAD Head:  Normocephalic and atraumatic. Eyes:  Sclera clear, no icterus.   Conjunctiva pink. Abdomen:  Soft, nontender, nondistended, normal bowel sounds, no rebound or guarding. No hepatosplenomegaly.   Neurologic:  Alert and  oriented x4;  grossly nonfocal Skin:  Intact without significant lesions or rashes. Psych:  Alert and cooperative. Normal mood and affect.     Jakiera Ehler L. Tarri Glenn, MD, MPH 08/07/2020, 8:37 AM

## 2020-08-07 ENCOUNTER — Ambulatory Visit (INDEPENDENT_AMBULATORY_CARE_PROVIDER_SITE_OTHER): Payer: Medicare Other | Admitting: Gastroenterology

## 2020-08-07 ENCOUNTER — Encounter: Payer: Self-pay | Admitting: Gastroenterology

## 2020-08-07 ENCOUNTER — Other Ambulatory Visit (INDEPENDENT_AMBULATORY_CARE_PROVIDER_SITE_OTHER): Payer: Medicare Other

## 2020-08-07 VITALS — BP 120/70 | HR 76 | Ht 68.0 in | Wt 199.4 lb

## 2020-08-07 DIAGNOSIS — K250 Acute gastric ulcer with hemorrhage: Secondary | ICD-10-CM

## 2020-08-07 DIAGNOSIS — D509 Iron deficiency anemia, unspecified: Secondary | ICD-10-CM

## 2020-08-07 LAB — CBC
HCT: 44.2 % (ref 39.0–52.0)
Hemoglobin: 14.4 g/dL (ref 13.0–17.0)
MCHC: 32.6 g/dL (ref 30.0–36.0)
MCV: 86.1 fl (ref 78.0–100.0)
Platelets: 139 10*3/uL — ABNORMAL LOW (ref 150.0–400.0)
RBC: 5.14 Mil/uL (ref 4.22–5.81)
RDW: 23.5 % — ABNORMAL HIGH (ref 11.5–15.5)
WBC: 6 10*3/uL (ref 4.0–10.5)

## 2020-08-07 MED ORDER — FERROUS SULFATE 325 (65 FE) MG PO TABS: 325.0000 mg | ORAL_TABLET | ORAL | 11 refills | Status: DC

## 2020-08-07 NOTE — Patient Instructions (Signed)
It was a pleasure to meet you today. Based on our discussion, I am providing you with my recommendations below:  RECOMMENDATION(S):   I am recommending repeat labs today to determine if your anemia has improved.  To better evaluate your symptoms, I recommending a repeat Endoscopy  Please continue taking Ferrous Sulfate 325mg  by mouth every other day  LABS:   . Please proceed to the basement level for lab work before leaving today. Press "B" on the elevator. The lab is located at the first door on the left as you exit the elevator.  HEALTHCARE LAWS AND MY CHART RESULTS:   . Due to recent changes in healthcare laws, you may see results of your imaging and/or laboratory studies on MyChart before I have had a chance to review them.  I understand that in some cases there may be results that are confusing or concerning to you. Please understand that not all results are received at same time and often I may need to interpret multiple results in order to provide you with the best plan of care or course of treatment. Therefore, I ask that you please give me 48 hours to thoroughly review all your results before contacting my office for clarification.   FOLLOW UP:  . After your procedure, you will receive a call from my office staff regarding my recommendation for follow up.  BMI:  . If you are age 91 or older, your body mass index should be between 23-30. Your Body mass index is 30.31 kg/m. If this is out of the aforementioned range listed, please consider follow up with your Primary Care Provider.  Thank you for trusting me with your gastrointestinal care!    Thornton Park, MD, MPH

## 2020-08-22 ENCOUNTER — Other Ambulatory Visit (HOSPITAL_COMMUNITY): Payer: Self-pay

## 2020-08-25 ENCOUNTER — Other Ambulatory Visit: Payer: No Typology Code available for payment source

## 2020-10-20 ENCOUNTER — Encounter: Payer: Medicare Other | Admitting: Gastroenterology

## 2020-10-28 ENCOUNTER — Encounter: Payer: Self-pay | Admitting: Internal Medicine

## 2020-12-03 ENCOUNTER — Ambulatory Visit (INDEPENDENT_AMBULATORY_CARE_PROVIDER_SITE_OTHER): Payer: No Typology Code available for payment source | Admitting: Internal Medicine

## 2020-12-03 ENCOUNTER — Encounter: Payer: Self-pay | Admitting: Internal Medicine

## 2020-12-03 ENCOUNTER — Other Ambulatory Visit: Payer: Self-pay

## 2020-12-03 VITALS — BP 124/67 | HR 64 | Temp 98.4°F | Ht 71.0 in | Wt 203.6 lb

## 2020-12-03 DIAGNOSIS — K250 Acute gastric ulcer with hemorrhage: Secondary | ICD-10-CM

## 2020-12-03 DIAGNOSIS — D124 Benign neoplasm of descending colon: Secondary | ICD-10-CM

## 2020-12-03 NOTE — Patient Instructions (Signed)
We will schedule you for upper endoscopy to ensure that the ulcer in your stomach has healed.  Continue on pantoprazole.  You can decrease this medication down to once daily.  Further recommendations to follow.  We will plan on repeat colonoscopy in 7 years for surveillance purposes.  It was nice meeting you today.  Thank you for your service to our country.  Dr. Abbey Chatters  At [facility] we value your feedback. You may receive a survey about your visit today. Please share your experience as we strive to create trusting relationships with our patients to provide genuine, compassionate, quality care.  We appreciate your understanding and patience as we review any laboratory studies, imaging, and other diagnostic tests that are ordered as we care for you. Our office policy is 5 business days for review of these results, and any emergent or urgent results are addressed in a timely manner for your best interest. If you do not hear from our office in 1 week, please contact us.   We also encourage the use of MyChart, which contains your medical information for your review as well. If you are not enrolled in this feature, an access code is on this after visit summary for your convenience. Thank you for allowing [place] to be involved in your care.  It was great to see you today!  I hope you have a great rest of your summer!!    Elon Alas. Abbey Chatters, D.O. Gastroenterology and Hepatology [facility]

## 2020-12-03 NOTE — Progress Notes (Signed)
Referring Provider: [facility] Physician:  [facility]:  Dr. Abbey Chatters  Chief Complaint  Patient presents with   gastric ulcer    Needs repeat procedure from his hospital back in march    HPI:   Charles Blackwell is a 65 y.o. male who presents to the clinic today for follow-up visit.  Previously seen by our office in August 2021 for iron deficiency anemia.  Was recommended that he undergo EGD and colonoscopy at that time but he declined.  He subsequently had a hospital admission at [facility] in March for symptomatic anemia with a hemoglobin of 6.7.  He underwent EGD which showed 1 gastric ulcer, biopsies negative for H. pylori.  Patient does note a history of NSAID use due to rotator cuff injuries.  Also underwent colonoscopy at time with 1 tubular adenoma removed.  He was discharged on oral iron therapy as well as twice daily pantoprazole.  He followed up with [facility] after his hospitalization.  Repeat hemoglobin vastly improved at greater than 14.  Was recommended he undergo repeat EGD to evaluate healing of his ulcer though per patient, his [place] insurance was not accepted.  He was sent to [place] for follow-up visit  Today states he is doing well, no melena hematochezia.  No abdominal pain.  Continues to take his pantoprazole.  No longer on iron.  Past Medical History:  Diagnosis Date   Anemia    Chronic back pain    Diabetes ([place])    GI bleeding 06/2020   Hyperlipidemia    Peptic ulcer     Past Surgical History:  Procedure Laterality Date   BACK SURGERY     X 4, rods and pins, took bone out of hip and put in back, removal of rods    BIOPSY  06/22/2020   Procedure: BIOPSY;  Surgeon: Thornton Park, MD;  Location: [place];  Service: Gastroenterology;;   COLONOSCOPY WITH PROPOFOL N/A 06/22/2020   Procedure: COLONOSCOPY WITH PROPOFOL;  Surgeon: Thornton Park, MD;  Location: [place];  Service: Gastroenterology;  Laterality: N/A;    ESOPHAGOGASTRODUODENOSCOPY (EGD) WITH PROPOFOL N/A 06/22/2020   Procedure: ESOPHAGOGASTRODUODENOSCOPY (EGD) WITH PROPOFOL;  Surgeon: Thornton Park, MD;  Location: [place];  Service: Gastroenterology;  Laterality: N/A;   POLYPECTOMY  06/22/2020   Procedure: POLYPECTOMY;  Surgeon: Thornton Park, MD;  Location: [facility] ENDOSCOPY;  Service: Gastroenterology;;    Current Outpatient Medications  Medication Sig Dispense Refill   atorvastatin (LIPITOR) 10 MG tablet Take 10 mg by mouth daily.     Cholecalciferol (VITAMIN D-3) 125 MCG (5000 UT) TABS Take 1 tablet by mouth in the morning and at bedtime.     DHEA 10 MG TABS Take 1 tablet by mouth daily.     glipiZIDE (GLUCOTROL) 5 MG tablet Take 5 mg by mouth daily before breakfast.     metFORMIN (GLUCOPHAGE) 500 MG tablet Take 1 tablet by mouth 2 (two) times daily.     methadone (DOLOPHINE) 10 MG tablet Take 20 mg by mouth every 12 (twelve) hours.     pantoprazole (PROTONIX) 40 MG tablet Take 1 tablet (40 mg total) by mouth 2 (two) times daily. Take '40mg'$  Po BID for 4weeks and then Once daily (Patient taking differently: Take 40 mg by mouth 2 (two) times daily.) 60 tablet 1   testosterone cypionate (DEPOTESTOSTERONE CYPIONATE) 200 MG/ML injection Inject 200 mg into the muscle every 14 (fourteen) days.     VITAMIN A PO Take  1 tablet by mouth daily.     vitamin C (ASCORBIC ACID) 500 MG tablet Take 500 mg by mouth daily.     No current facility-administered medications for this visit.    Allergies as of 12/03/2020   (No Known Allergies)    Family History  Problem Relation Age of Onset   Cancer Mother        unknown kind of cancer   Heart disease Paternal Uncle    Colon cancer Neg Hx    Colon polyps Neg Hx     Social History   Socioeconomic History   Marital status: Married    Spouse name: Not on file   Number of children: 2   Years of education: Not on file   Highest education level: Not on file  Occupational History   Occupation:  disability  Tobacco Use   Smoking status: Never   Smokeless tobacco: Never  Vaping Use   Vaping Use: Never used  Substance and Sexual Activity   Alcohol use: Never   Drug use: Not Currently   Sexual activity: Not on file  Other Topics Concern   Not on file  Social History Narrative   Not on file   Social Determinants of Health   Financial Resource Strain: Not on file  Food Insecurity: Not on file  Transportation Needs: Not on file  Physical Activity: Not on file  Stress: Not on file  Social Connections: Not on file    Subjective: Review of Systems  Constitutional:  Negative for chills and fever.  HENT:  Negative for congestion and hearing loss.   Eyes:  Negative for blurred vision and double vision.  Respiratory:  Negative for cough and shortness of breath.   Cardiovascular:  Negative for chest pain and palpitations.  Gastrointestinal:  Negative for abdominal pain, blood in stool, constipation, diarrhea, heartburn, melena and vomiting.  Genitourinary:  Negative for dysuria and urgency.  Musculoskeletal:  Negative for joint pain and myalgias.  Skin:  Negative for itching and rash.  Neurological:  Negative for dizziness and headaches.  Psychiatric/Behavioral:  Negative for depression. The patient is not nervous/anxious.     Objective: BP 124/67   Pulse 64   Temp 98.4 F (36.9 C)   Ht '5\' 11"'$  (1.803 m)   Wt 203 lb 9.6 oz (92.4 kg)   BMI 28.40 kg/m  Physical Exam Constitutional:      Appearance: Normal appearance.  HENT:     Head: Normocephalic and atraumatic.  Eyes:     Extraocular Movements: Extraocular movements intact.     Conjunctiva/sclera: Conjunctivae normal.  Cardiovascular:     Rate and Rhythm: Normal rate and regular rhythm.  Pulmonary:     Effort: Pulmonary effort is normal.     Breath sounds: Normal breath sounds.  Abdominal:     General: Bowel sounds are normal.     Palpations: Abdomen is soft.  Musculoskeletal:        General: Normal range  of motion.     Cervical back: Normal range of motion and neck supple.  Skin:    General: Skin is warm.  Neurological:     General: No focal deficit present.     Mental Status: He is alert and oriented to person, place, and time.  Psychiatric:        Mood and Affect: Mood normal.        Behavior: Behavior normal.     Assessment: *Gastric ulcer with associated bleeding *Acute blood loss anemia due  to above-improved *Adenomatous colon polyp  Plan: Patient doing well since his hospitalization.  Most recent hemoglobin greater than 14.  No longer taking iron.  Patient needs repeat upper endoscopy to ensure healing of his ulcer.  We will schedule today. The risks including infection, bleed, or perforation as well as benefits, limitations, alternatives and imponderables have been reviewed with the patient. Potential for esophageal dilation, biopsy, etc. have also been reviewed.  Questions have been answered. All parties agreeable.  Continue on pantoprazole daily.  Avoid NSAIDs as best as he can.  He is meeting with Ortho soon to discuss his rotator cuff issues.  Colonoscopy recall 2029 for surveillance purposes.  12/03/2020 2:13 PM   Disclaimer: This note was dictated with voice recognition software. Similar sounding words can inadvertently be transcribed and may not be corrected upon review.

## 2020-12-04 ENCOUNTER — Telehealth: Payer: Self-pay

## 2020-12-04 NOTE — Telephone Encounter (Signed)
Pt was scheduled for EGD w/Propofol ASA 3 w/Dr. Abbey Chatters yesterday at [place] for 12/19/20 at 2:00pm. Received message from endo scheduler Dr. Abbey Chatters will be working until 12:00pm.  Called pt, EGD moved up to 12/19/20 at 10:30am. Endo scheduler informed.

## 2020-12-04 NOTE — Telephone Encounter (Signed)
Pre-op appt 12/16/20 at 1:15pm. Appt letter mailed with procedure instructions.

## 2020-12-12 NOTE — Patient Instructions (Signed)
Charles Blackwell  12/12/2020     '@PREFPERIOPPHARMACY'$ @   Your procedure is scheduled on 12/19/2020.   Report to [facility] at  0830  A.M.   Call this number if you have problems the morning of surgery:  [phone]   Remember:  Follow the diet instructions given to you bythe office.    DO NOT take any medications for diabetes the morning of your procedure.     Take these medicines the morning of surgery with A SIP OF WATER            methadone (if needed), protonix.     Do not wear jewelry, make-up or nail polish.  Do not wear lotions, powders, or perfumes, or deodorant.  Do not shave 48 hours prior to surgery.  Men may shave face and neck.  Do not bring valuables to the hospital.  [facility] is not responsible for any belongings or valuables.  Contacts, dentures or bridgework may not be worn into surgery.  Leave your suitcase in the car.  After surgery it may be brought to your room.  For patients admitted to the hospital, discharge time will be determined by your treatment team.  Patients discharged the day of surgery will not be allowed to drive home and must have someone with them for 24 hours.    Special instructions:   DO NOT smoke tobacco or vape fore 24 hours before your procedure.  Please read over the following fact sheets that you were given. Anesthesia Post-op Instructions and Care and Recovery After Surgery      Upper Endoscopy, Adult, Care After This sheet gives you information about how to care for yourself after your procedure. Your health care provider may also give you more specific instructions. If you have problems or questions, contact your health care provider. What can I expect after the procedure? After the procedure, it is common to have: A sore throat. Mild stomach pain or discomfort. Bloating. Nausea. Follow these instructions at home:  Follow instructions from your health care provider about what to eat or drink after your  procedure. Return to your normal activities as told by your health care provider. Ask your health care provider what activities are safe for you. Take over-the-counter and prescription medicines only as told by your health care provider. If you were given a sedative during the procedure, it can affect you for several hours. Do not drive or operate machinery until your health care provider says that it is safe. Keep all follow-up visits as told by your health care provider. This is important. Contact a health care provider if you have: A sore throat that lasts longer than one day. Trouble swallowing. Get help right away if: You vomit blood or your vomit looks like coffee grounds. You have: A fever. Bloody, black, or tarry stools. A severe sore throat or you cannot swallow. Difficulty breathing. Severe pain in your chest or abdomen. Summary After the procedure, it is common to have a sore throat, mild stomach discomfort, bloating, and nausea. If you were given a sedative during the procedure, it can affect you for several hours. Do not drive or operate machinery until your health care provider says that it is safe. Follow instructions from your health care provider about what to eat or drink after your procedure. Return to your normal activities as told by your health care provider. This information is not intended to replace advice given to you by your  health care provider. Make sure you discuss any questions you have with your health care provider. Document Revised: 03/20/2019 Document Reviewed: 08/22/2017 Elsevier Patient Education  2022 [place] After This sheet gives you information about how to care for yourself after your procedure. Your health care provider may also give you more specific instructions. If you have problems or questions, contact your health care provider. What can I expect after the procedure? After the procedure, it is common to  have: Tiredness. Forgetfulness about what happened after the procedure. Impaired judgment for important decisions. Nausea or vomiting. Some difficulty with balance. Follow these instructions at home: For the time period you were told by your health care provider:   Rest as needed. Do not participate in activities where you could fall or become injured. Do not drive or use machinery. Do not drink alcohol. Do not take sleeping pills or medicines that cause drowsiness. Do not make important decisions or sign legal documents. Do not take care of children on your own. Eating and drinking Follow the diet that is recommended by your health care provider. Drink enough fluid to keep your urine pale yellow. If you vomit: Drink water, juice, or soup when you can drink without vomiting. Make sure you have little or no nausea before eating solid foods. General instructions Have a responsible adult stay with you for the time you are told. It is important to have someone help care for you until you are awake and alert. Take over-the-counter and prescription medicines only as told by your health care provider. If you have sleep apnea, surgery and certain medicines can increase your risk for breathing problems. Follow instructions from your health care provider about wearing your sleep device: Anytime you are sleeping, including during daytime naps. While taking prescription pain medicines, sleeping medicines, or medicines that make you drowsy. Avoid smoking. Keep all follow-up visits as told by your health care provider. This is important. Contact a health care provider if: You keep feeling nauseous or you keep vomiting. You feel light-headed. You are still sleepy or having trouble with balance after 24 hours. You develop a rash. You have a fever. You have redness or swelling around the IV site. Get help right away if: You have trouble breathing. You have new-onset confusion at  home. Summary For several hours after your procedure, you may feel tired. You may also be forgetful and have poor judgment. Have a responsible adult stay with you for the time you are told. It is important to have someone help care for you until you are awake and alert. Rest as told. Do not drive or operate machinery. Do not drink alcohol or take sleeping pills. Get help right away if you have trouble breathing, or if you suddenly become confused. This information is not intended to replace advice given to you by your health care provider. Make sure you discuss any questions you have with your health care provider. Document Revised: 12/06/2019 Document Reviewed: 02/22/2019 Elsevier Patient Education  2022 [employer].

## 2020-12-16 ENCOUNTER — Other Ambulatory Visit: Payer: Self-pay

## 2020-12-16 ENCOUNTER — Encounter (HOSPITAL_COMMUNITY)
Admission: RE | Admit: 2020-12-16 | Discharge: 2020-12-16 | Disposition: A | Discharge status: Home / Self Care | Payer: Medicare Other | Source: Ambulatory Visit | Attending: Internal Medicine | Admitting: Internal Medicine

## 2020-12-16 ENCOUNTER — Encounter (HOSPITAL_COMMUNITY): Payer: Self-pay

## 2020-12-16 DIAGNOSIS — D649 Anemia, unspecified: Secondary | ICD-10-CM | POA: Diagnosis not present

## 2020-12-16 DIAGNOSIS — Z01812 Encounter for preprocedural laboratory examination: Secondary | ICD-10-CM | POA: Insufficient documentation

## 2020-12-16 HISTORY — DX: Unspecified rotator cuff tear or rupture of unspecified shoulder, not specified as traumatic: M75.100

## 2020-12-16 LAB — BASIC METABOLIC PANEL
Anion gap: 7 (ref 5–15)
BUN: 16 mg/dL (ref 8–23)
CO2: 25 mmol/L (ref 22–32)
Calcium: 9 mg/dL (ref 8.9–10.3)
Chloride: 106 mmol/L (ref 98–111)
Creatinine, Ser: 0.86 mg/dL (ref 0.61–1.24)
GFR, Estimated: >60 mL/min (ref >60)
Glucose, Bld: 175 mg/dL — ABNORMAL HIGH (ref 70–99)
Potassium: 4.3 mmol/L (ref 3.5–5.1)
Sodium: 138 mmol/L (ref 135–145)

## 2020-12-16 LAB — CBC WITH DIFFERENTIAL/PLATELET
Abs Immature Granulocytes: 0.03 10*3/uL (ref 0.00–0.07)
Basophils Absolute: 0 10*3/uL (ref 0.0–0.1)
Basophils Relative: 1 %
Eosinophils Absolute: 0.2 10*3/uL (ref 0.0–0.5)
Eosinophils Relative: 3 %
HCT: 42.6 % (ref 39.0–52.0)
Hemoglobin: 14 g/dL (ref 13.0–17.0)
Immature Granulocytes: 1 %
Lymphocytes Relative: 13 %
Lymphs Abs: 0.8 10*3/uL (ref 0.7–4.0)
MCH: 30.8 pg (ref 26.0–34.0)
MCHC: 32.9 g/dL (ref 30.0–36.0)
MCV: 93.6 fL (ref 80.0–100.0)
Monocytes Absolute: 0.5 10*3/uL (ref 0.1–1.0)
Monocytes Relative: 9 %
Neutro Abs: 4.4 10*3/uL (ref 1.7–7.7)
Neutrophils Relative %: 73 %
Platelets: 100 10*3/uL — ABNORMAL LOW (ref 150–400)
RBC: 4.55 MIL/uL (ref 4.22–5.81)
RDW: 14.3 % (ref 11.5–15.5)
WBC: 6 10*3/uL (ref 4.0–10.5)
nRBC: 0 % (ref 0.0–0.2)

## 2020-12-18 ENCOUNTER — Encounter (HOSPITAL_COMMUNITY): Payer: Self-pay | Admitting: Anesthesiology

## 2020-12-19 ENCOUNTER — Ambulatory Visit (HOSPITAL_COMMUNITY): Admission: RE | Admit: 2020-12-19 | Payer: No Typology Code available for payment source | Source: Home / Self Care

## 2020-12-19 ENCOUNTER — Encounter (HOSPITAL_COMMUNITY): Admission: RE | Payer: Self-pay | Source: Home / Self Care

## 2020-12-19 SURGERY — ESOPHAGOGASTRODUODENOSCOPY (EGD) WITH PROPOFOL
Anesthesia: Monitor Anesthesia Care

## 2020-12-19 NOTE — OR Nursing (Signed)
Called to cancel procedure due to family emergency.

## 2020-12-23 ENCOUNTER — Telehealth: Payer: Self-pay | Admitting: Internal Medicine

## 2020-12-23 NOTE — Telephone Encounter (Signed)
Please call patient , his wife is sick and he needs to reschedule his procedure

## 2020-12-23 NOTE — Telephone Encounter (Signed)
Called pt. His spouse is sick in the hospital and did not know when he could have procedure done. Advised to call when ready to r/s.

## 2021-01-26 ENCOUNTER — Ambulatory Visit: Payer: No Typology Code available for payment source | Admitting: Cardiology

## 2023-02-28 ENCOUNTER — Ambulatory Visit: Payer: Medicare Other | Admitting: Gastroenterology

## 2023-02-28 NOTE — Progress Notes (Deleted)
GI Office Note    Referring Provider: Byrd Hesselbach, PA Primary Care Physician:  Byrd Hesselbach, PA  Primary Gastroenterologist: Hennie Duos. Marletta Lor, DO   Chief Complaint   No chief complaint on file.   History of Present Illness   Charles Blackwell is a 67 y.o. male presenting today at the request of Byrd Hesselbach for further evaluation of IDA.   Patient last seen 11/2020.   [facility] referrla ***  Previously seen by our office in August 2021 for iron deficiency anemia.  Was recommended that he undergo EGD and colonoscopy at that time but he declined.  He subsequently had a hospital admission at [facility] in March for symptomatic anemia with a hemoglobin of 6.7.  He underwent EGD which showed 1 gastric ulcer, biopsies negative for H. pylori.  Patient does note a history of NSAID use due to rotator cuff injuries.  Also underwent colonoscopy at time with 1 tubular adenoma removed.  He was discharged on oral iron therapy as well as twice daily pantoprazole.   He followed up with [facility] after his hospitalization.  Repeat hemoglobin vastly improved at greater than 14.  Was recommended he undergo repeat EGD to evaluate healing of his ulcer though per patient, his [place] insurance was not accepted.  He was sent to [place] for follow-up visit. We arrange for follow up EGD but patient cancelled due to wife being sick and was lost to follow up.         Medications   Current Outpatient Medications  Medication Sig Dispense Refill   atorvastatin (LIPITOR) 10 MG tablet Take 10 mg by mouth daily.     Cholecalciferol (VITAMIN D-3) 125 MCG (5000 UT) TABS Take 5,000 Units by mouth in the morning and at bedtime.     DHEA 10 MG TABS Take 10 mg by mouth daily.     metFORMIN (GLUCOPHAGE) 500 MG tablet Take 500 mg by mouth 2 (two) times daily.     methadone (DOLOPHINE) 10 MG tablet Take 10 mg by mouth in the morning, at noon, and at bedtime.     oxyCODONE-acetaminophen (PERCOCET) 10-325 MG tablet Take 1  tablet by mouth 2 (two) times daily as needed for pain.     pantoprazole (PROTONIX) 40 MG tablet Take 1 tablet (40 mg total) by mouth 2 (two) times daily. Take 40mg  Po BID for 4weeks and then Once daily 60 tablet 1   testosterone cypionate (DEPOTESTOSTERONE CYPIONATE) 200 MG/ML injection Inject 200 mg into the muscle every 14 (fourteen) days.     VITAMIN A PO Take 1 tablet by mouth daily.     vitamin C (ASCORBIC ACID) 500 MG tablet Take 500 mg by mouth daily.     No current facility-administered medications for this visit.    Allergies   Allergies as of 02/28/2023   (No Known Allergies)     Past Medical History   Past Medical History:  Diagnosis Date   Anemia    Chronic back pain    Diabetes ([facility])    GI bleeding 06/2020   Hyperlipidemia    Peptic ulcer    Torn rotator cuff     Past Surgical History   Past Surgical History:  Procedure Laterality Date   BACK SURGERY     X 4, rods and pins, took bone out of hip and put in back, removal of rods    BIOPSY  06/22/2020   Procedure: BIOPSY;  Surgeon: Tressia Danas, MD;  Location: [facility] ENDOSCOPY;  Service: Gastroenterology;;   COLONOSCOPY WITH PROPOFOL N/A 06/22/2020   Procedure: COLONOSCOPY WITH PROPOFOL;  Surgeon: Tressia Danas, MD;  Location: [facility] ENDOSCOPY;  Service: Gastroenterology;  Laterality: N/A;   ESOPHAGOGASTRODUODENOSCOPY (EGD) WITH PROPOFOL N/A 06/22/2020   Procedure: ESOPHAGOGASTRODUODENOSCOPY (EGD) WITH PROPOFOL;  Surgeon: Tressia Danas, MD;  Location: [facility] ENDOSCOPY;  Service: Gastroenterology;  Laterality: N/A;   POLYPECTOMY  06/22/2020   Procedure: POLYPECTOMY;  Surgeon: Tressia Danas, MD;  Location: [facility] ENDOSCOPY;  Service: Gastroenterology;;    Past Family History   Family History  Problem Relation Age of Onset   Cancer Mother        unknown kind of cancer   Heart disease Paternal Uncle    Colon cancer Neg Hx    Colon polyps Neg Hx     Past Social History   Social History   Socioeconomic  History   Marital status: Married    Spouse name: Not on file   Number of children: 2   Years of education: Not on file   Highest education level: Not on file  Occupational History   Occupation: disability  Tobacco Use   Smoking status: Never   Smokeless tobacco: Never  Vaping Use   Vaping status: Never Used  Substance and Sexual Activity   Alcohol use: Never   Drug use: Not Currently   Sexual activity: Not on file  Other Topics Concern   Not on file  Social History Narrative   Not on file   Social Determinants of Health   Financial Resource Strain: Not on file  Food Insecurity: Not on file  Transportation Needs: Not on file  Physical Activity: Not on file  Stress: Not on file  Social Connections: Unknown (10/29/2022)   [facility]   Social Network    Social Network: Not on file  Intimate Partner Violence: Unknown (10/29/2022)   Received from [facility]   HITS    Physically Hurt: Not on file    Insult or Talk Down To: Not on file    Threaten Physical Harm: Not on file    Scream or Curse: Not on file    Review of Systems   General: Negative for anorexia, weight loss, fever, chills, fatigue, weakness. ENT: Negative for hoarseness, difficulty swallowing , nasal congestion. CV: Negative for chest pain, angina, palpitations, dyspnea on exertion, peripheral edema.  Respiratory: Negative for dyspnea at rest, dyspnea on exertion, cough, sputum, wheezing.  GI: See history of present illness. GU:  Negative for dysuria, hematuria, urinary incontinence, urinary frequency, nocturnal urination.  Endo: Negative for unusual weight change.     Physical Exam   There were no vitals taken for this visit.   General: Well-nourished, well-developed in no acute distress.  Eyes: No icterus. Mouth: Oropharyngeal mucosa moist and pink , no lesions erythema or exudate. Lungs: Clear to auscultation bilaterally.  Heart: Regular rate and rhythm, no murmurs rubs or  gallops.  Abdomen: Bowel sounds are normal, nontender, nondistended, no hepatosplenomegaly or masses,  no abdominal bruits or hernia , no rebound or guarding.  Rectal: ***  Extremities: No lower extremity edema. No clubbing or deformities. Neuro: Alert and oriented x 4   Skin: Warm and dry, no jaundice.   Psych: Alert and cooperative, normal mood and affect.  Labs   *** Imaging Studies   No results found.  Assessment       PLAN   ***   Leanna Battles. Melvyn Neth, MHS, PA-C [facility]

## 2023-03-07 ENCOUNTER — Encounter: Payer: Self-pay | Admitting: Gastroenterology

## 2023-03-07 ENCOUNTER — Ambulatory Visit (INDEPENDENT_AMBULATORY_CARE_PROVIDER_SITE_OTHER): Payer: No Typology Code available for payment source | Admitting: Gastroenterology

## 2023-03-07 VITALS — BP 170/93 | HR 76 | Temp 98.4°F | Ht 71.0 in | Wt 206.8 lb

## 2023-03-07 DIAGNOSIS — K633 Ulcer of intestine: Secondary | ICD-10-CM

## 2023-03-07 DIAGNOSIS — F1090 Alcohol use, unspecified, uncomplicated: Secondary | ICD-10-CM

## 2023-03-07 DIAGNOSIS — Z8711 Personal history of peptic ulcer disease: Secondary | ICD-10-CM | POA: Insufficient documentation

## 2023-03-07 DIAGNOSIS — D509 Iron deficiency anemia, unspecified: Secondary | ICD-10-CM | POA: Diagnosis not present

## 2023-03-07 DIAGNOSIS — R197 Diarrhea, unspecified: Secondary | ICD-10-CM | POA: Insufficient documentation

## 2023-03-07 DIAGNOSIS — K922 Gastrointestinal hemorrhage, unspecified: Secondary | ICD-10-CM | POA: Insufficient documentation

## 2023-03-07 DIAGNOSIS — Z8619 Personal history of other infectious and parasitic diseases: Secondary | ICD-10-CM

## 2023-03-07 NOTE — Patient Instructions (Signed)
We will schedule you for colonoscopy and upper endoscopy with enteroscopy of your upper small bowel (if [facility] approves).

## 2023-03-07 NOTE — Progress Notes (Signed)
GI Office Note    Referring Provider: Byrd Hesselbach, PA Primary Care Physician:  Byrd Hesselbach, PA  Primary Gastroenterologist: Hennie Duos. Marletta Lor, DO   Chief Complaint   Chief Complaint  Patient presents with   Anemia     History of Present Illness   Charles Blackwell is a 67 y.o. male presenting today at the request of Gwenyth Bouillon, PA ([place]) for iron deficiency anemia, requesting EGD and colonoscopy for new drop from hemoglobin of 15-11.7 (08/2022).  Patient states he was sent here for EGD and colonoscopy due to anemia and diarrhea for past six months. He had positive Cdiff assay in 06/2022 by local PCP, Dr. Reuel Boom. Says he was treated with several rounds of antibiotics. [facility]. He has been taking daily fiber and using imodium 4mg  every morning with adequate control of his stools but if misses a dose, he has recurrent symptoms. He does have some lower abdominal pain, sometimes better with BMs. No melena, brbpr. A lot of gas. No heartburn, dysphagia, weight loss.   Patient states he drinks a pint of etoh every 2 days, using to assist with pain control with decrease in pain medication by pain clinic, notes increased etoh consumption over the past six months. . Denies NSAIDs since 2022 when he presented with UGI bleed due to gastric ulcer. Colonoscopy at that time as well.   Repeat EGD in August 2023 with acute gastric ulcer with hemorrhage suspicious for focal intestinal metaplasia 2023, no h.pylori, complete at the [place].  PillCam completed showing multiple erosions in the small bowel, June 2024    Labs 11/2022: Hgb 15.3, platelets 106000, iron 68, TIBC 367, ferritin 22.2, iron sat 19L, GI panel neg, O+P neg, Cdiff neg, lactoferrin neg, calprotetine 77, borderline.  Labs 02/2023: ALT 29, AST 408, Tbili 1.2. A1C 5.9. platelet 141, Hct 46.9.  EGD March 2022:  -LA grade a reflux esophagitis -Nonbleeding gastric ulcer, likely source of anemia  status post biopsy, negative for H. Pylori -EGD in 3 months given gastric ulcer  Colonoscopy March 2022:   -3 mm polyp in the proximal descending colon removed, tubular adenoma -Colonoscopy in 7 years    Medications   Current Outpatient Medications  Medication Sig Dispense Refill   Cholecalciferol (VITAMIN D-3) 125 MCG (5000 UT) TABS Take 5,000 Units by mouth in the morning and at bedtime.     loperamide (IMODIUM A-D) 2 MG tablet Take 4 mg by mouth daily.     methadone (DOLOPHINE) 10 MG tablet Take 10 mg by mouth in the morning, at noon, and at bedtime.     metoprolol tartrate (LOPRESSOR) 25 MG tablet Take 12.5 mg by mouth 2 (two) times daily.     oxyCODONE-acetaminophen (PERCOCET) 10-325 MG tablet Take 1 tablet by mouth 2 (two) times daily as needed for pain.     pantoprazole (PROTONIX) 40 MG tablet Take 1 tablet (40 mg total) by mouth 2 (two) times daily. Take 40mg  Po BID for 4weeks and then Once daily 60 tablet 1   testosterone cypionate (DEPOTESTOSTERONE CYPIONATE) 200 MG/ML injection Inject 200 mg into the muscle every 14 (fourteen) days.     No current facility-administered medications for this visit.    Allergies   Allergies as of 03/07/2023   (No Known Allergies)    Past Medical History   Past Medical History:  Diagnosis Date   Anemia    Chronic back pain    Diabetes (HCC)  GI bleeding 06/2020   Hyperlipidemia    Peptic ulcer    Torn rotator cuff     Past Surgical History   Past Surgical History:  Procedure Laterality Date   BACK SURGERY     X 4, rods and pins, took bone out of hip and put in back, removal of rods    BIOPSY  06/22/2020   Procedure: BIOPSY;  Surgeon: Tressia Danas, MD;  Location: [facility] ENDOSCOPY;  Service: Gastroenterology;;   COLONOSCOPY WITH PROPOFOL N/A 06/22/2020   Procedure: COLONOSCOPY WITH PROPOFOL;  Surgeon: Tressia Danas, MD;  Location: [facility] ENDOSCOPY;  Service: Gastroenterology;  Laterality: N/A;   ESOPHAGOGASTRODUODENOSCOPY  (EGD) WITH PROPOFOL N/A 06/22/2020   Procedure: ESOPHAGOGASTRODUODENOSCOPY (EGD) WITH PROPOFOL;  Surgeon: Tressia Danas, MD;  Location: [facility] ENDOSCOPY;  Service: Gastroenterology;  Laterality: N/A;   POLYPECTOMY  06/22/2020   Procedure: POLYPECTOMY;  Surgeon: Tressia Danas, MD;  Location: [facility] ENDOSCOPY;  Service: Gastroenterology;;    Past Family History   Family History  Problem Relation Age of Onset   Cancer Mother        unknown kind of cancer   Heart disease Paternal Uncle    Colon cancer Neg Hx    Colon polyps Neg Hx     Past Social History   Social History   Socioeconomic History   Marital status: Married    Spouse name: Not on file   Number of children: 2   Years of education: Not on file   Highest education level: Not on file  Occupational History   Occupation: disability  Tobacco Use   Smoking status: Never   Smokeless tobacco: Never  Vaping Use   Vaping status: Never Used  Substance and Sexual Activity   Alcohol use: Never   Drug use: Not Currently   Sexual activity: Yes  Other Topics Concern   Not on file  Social History Narrative   Not on file   Social Determinants of Health   Financial Resource Strain: Not on file  Food Insecurity: Not on file  Transportation Needs: Not on file  Physical Activity: Not on file  Stress: Not on file  Social Connections: Unknown (10/29/2022)   Received from [facility]   Social Network    Social Network: Not on file  Intimate Partner Violence: Unknown (10/29/2022)   Received from [facility]   HITS    Physically Hurt: Not on file    Insult or Talk Down To: Not on file    Threaten Physical Harm: Not on file    Scream or Curse: Not on file    Review of Systems   General: Negative for anorexia, weight loss, fever, chills, fatigue, weakness. Eyes: Negative for vision changes.  ENT: Negative for hoarseness, difficulty swallowing , nasal congestion. CV: Negative for chest pain, angina, palpitations, dyspnea  on exertion, peripheral edema.  Respiratory: Negative for dyspnea at rest, dyspnea on exertion, cough, sputum, wheezing.  GI: See history of present illness. GU:  Negative for dysuria, hematuria, urinary incontinence, urinary frequency, nocturnal urination.  MS: Negative for joint pain. chronic back pain.  Derm: Negative for rash or itching.  Neuro: Negative for weakness, abnormal sensation, seizure, frequent headaches, memory loss,  confusion.  Psych: Negative for anxiety, depression, suicidal ideation, hallucinations.  Endo: Negative for unusual weight change.  Heme: Negative for bruising or bleeding. Allergy: Negative for rash or hives.  Physical Exam   BP (!) 170/93 (BP Location: Right Arm, Patient Position: Sitting, Cuff Size: Large)   Pulse 76  Temp 98.4 F (36.9 C) (Oral)   Ht 5\' 11"  (1.803 m)   Wt 206 lb 12.8 oz (93.8 kg)   SpO2 96%   BMI 28.84 kg/m    General: Well-nourished, well-developed in no acute distress.  Head: Normocephalic, atraumatic.   Eyes: Conjunctiva pink, no icterus. Mouth: Oropharyngeal mucosa moist and pink , no lesions erythema or exudate. Neck: Supple without thyromegaly, masses, or lymphadenopathy.  Lungs: Clear to auscultation bilaterally.  Heart: Regular rate and rhythm, no murmurs rubs or gallops.  Abdomen: Bowel sounds are normal, nontender, nondistended, no hepatosplenomegaly or masses,  no abdominal bruits or hernia, no rebound or guarding.   Rectal: not performed Extremities: No lower extremity edema. No clubbing or deformities.  Neuro: Alert and oriented x 4 , grossly normal neurologically.  Skin: Warm and dry, no rash or jaundice.   Psych: Alert and cooperative, normal mood and affect.  Labs   See hpi Imaging Studies   No results found.  Assessment/Plan:   IDA -h/o bleeding gastric ulcers in 2022, 2023. Small bowel erosions reported on prior capsule study in 09/2022, unclear if proximal or distal. No further NSAIDs since 2022.  Significant etoh use. With significant diarrhea, query possibility of Crohn's . -recommend colonoscopy with push enteroscopy (if approved by [facility] [place] [facility]) otherwise would complete EGD.  I have discussed the risks, alternatives, benefits with regards to but not limited to the risk of reaction to medication, bleeding, infection, perforation and the patient is agreeable to proceed. Written consent to be obtained. -continue pantoprazole  Diarrhea -history of Cdiff in 06/2022, patient states he received several rounds of antibiotics by Dr. Reuel Boom. Recent stool studies at [facility] with negative GI path panel, negative Cdiff. Fecal calprotectine borderline -colonoscopy in the near future. Consider random colon biopsies if needed -EGD with possible push enteroscopy if [facility]. Consider small bowel biopsies to rule out celiac.  I have discussed the risks, alternatives, benefits with regards to but not limited to the risk of reaction to medication, bleeding, infection, perforation and the patient is agreeable to proceed. Written consent to be obtained.   Leanna Battles. Melvyn Neth, MHS, PA-C [facility]

## 2023-03-14 ENCOUNTER — Telehealth: Payer: Self-pay | Admitting: *Deleted

## 2023-03-14 MED ORDER — PEG 3350-KCL-NA BICARB-NACL 420 G PO SOLR: 4000.0000 mL | Freq: Once | ORAL | 0 refills | Status: AC

## 2023-03-14 NOTE — Telephone Encounter (Signed)
Called pt and he is aware of pre-op appt details. He voiced understanding.  

## 2023-03-14 NOTE — Telephone Encounter (Signed)
Per [place] at the [place], push entero would be covered. Called pt and scheduled for TCS/PUSH ENTERO with Dr. Marletta Lor ASA 3 on 1/3. Aware will send instructions, rx for prep to be sent to pharmacy. Will call back with pre-op appt. Also confirmed address/pharmacy. Aware to stop imodium 3 days prior.

## 2023-03-14 NOTE — Telephone Encounter (Signed)
Patient needs TCS with push enteroscopy. Patient wants to make sure the [facility] will [facility] this. I don't see anything on the auth about a push enteroscopy. Darl Pikes, can you check with the [facility] if this is included in the auth we have on file.

## 2023-03-31 NOTE — Patient Instructions (Signed)
Charles Blackwell  03/31/2023     @PREFPERIOPPHARMACY @   Your procedure is scheduled on  04/08/2023.   [facility] to [facility] at  [phone] A.M.   Call this number if you have problems the morning of surgery:  [phone]  If you experience any cold or flu symptoms such as cough, fever, chills, shortness of breath, etc. between now and your scheduled surgery, please notify us at the above number.   Remember:        DO NOT take any medications for diabetes the morning of your procedure.   Follow the diet and prep instructions given to you by the office.    You may drink clear liquids until 0415 am on 04/07/2022.    Clear liquids allowed are:                    Water, Juice (No red color; non-citric and without pulp; diabetics please choose diet or no sugar options), Carbonated beverages (diabetics please choose diet or no sugar options), Clear Tea (No creamer, milk, or cream, including half & half and powdered creamer), Black Coffee Only (No creamer, milk or cream, including half & half and powdered creamer), and Clear Sports drink (No red color; diabetics please choose diet or no sugar options)    Take these medicines the morning of surgery with A SIP OF WATER            methadone(if needed), metoprolol, pantoprazole.     Do not wear jewelry, make-up or nail polish, including gel polish,  artificial nails, or any other type of covering on natural nails (fingers and  toes).  Do not wear lotions, powders, or perfumes, or deodorant.  Do not shave 48 hours prior to surgery.  Men may shave face and neck.  Do not bring valuables to the hospital.  [facility] is not responsible for any belongings or valuables.  Contacts, dentures or bridgework may not be worn into surgery.  Leave your suitcase in the car.  After surgery it may be brought to your room.  For patients admitted to the hospital, discharge time will be determined by your treatment team.  Patients discharged the day of  surgery will not be allowed to drive home and must have someone with them for 24 hours.    Special instructions:   DO NOT smoke tobacco or vape for 24 hours before your procedure.  Please read over the following fact sheets that you were given. Anesthesia Post-op Instructions and Care and Recovery After Surgery      Colonoscopy, Adult, Care After The following information offers guidance on how to care for yourself after your procedure. Your health care provider may also give you more specific instructions. If you have problems or questions, contact your health care provider. What can I expect after the procedure? After the procedure, it is common to have: A small amount of blood in your stool for 24 hours after the procedure. Some gas. Mild cramping or bloating of your abdomen. Follow these instructions at home: Eating and drinking  Drink enough fluid to keep your urine pale yellow. Follow instructions from your health care provider about eating or drinking restrictions. Resume your normal diet as told by your health care provider. Avoid heavy or fried foods that are hard to digest. Activity Rest as told by your health care provider. Avoid sitting for a long time without moving. Get up to take short walks every 1-2  hours. This is important to improve blood flow and breathing. Ask for help if you feel weak or unsteady. Return to your normal activities as told by your health care provider. Ask your health care provider what activities are safe for you. Managing cramping and bloating  Try walking around when you have cramps or feel bloated. If directed, apply heat to your abdomen as told by your health care provider. Use the heat source that your health care provider recommends, such as a moist heat pack or a heating pad. Place a towel between your skin and the heat source. Leave the heat on for 20-30 minutes. Remove the heat if your skin turns bright red. This is especially important  if you are unable to feel pain, heat, or cold. You have a greater risk of getting burned. General instructions If you were given a sedative during the procedure, it can affect you for several hours. Do not drive or operate machinery until your health care provider says that it is safe. For the first 24 hours after the procedure: Do not sign important documents. Do not drink alcohol. Do your regular daily activities at a slower pace than normal. Eat soft foods that are easy to digest. Take over-the-counter and prescription medicines only as told by your health care provider. Keep all follow-up visits. This is important. Contact a health care provider if: You have blood in your stool 2-3 days after the procedure. Get help right away if: You have more than a small spotting of blood in your stool. You have large blood clots in your stool. You have swelling of your abdomen. You have nausea or vomiting. You have a fever. You have increasing pain in your abdomen that is not relieved with medicine. These symptoms may be an emergency. Get help right away. Call 911. Do not wait to see if the symptoms will go away. Do not drive yourself to the hospital. Summary After the procedure, it is common to have a small amount of blood in your stool. You may also have mild cramping and bloating of your abdomen. If you were given a sedative during the procedure, it can affect you for several hours. Do not drive or operate machinery until your health care provider says that it is safe. Get help right away if you have a lot of blood in your stool, nausea or vomiting, a fever, or increased pain in your abdomen. This information is not intended to replace advice given to you by your health care provider. Make sure you discuss any questions you have with your health care provider. Document Revised: 05/04/2022 Document Reviewed: 11/12/2020 Elsevier Patient Education  2024 Elsevier Inc. Monitored Anesthesia Care,  Care After The following information offers guidance on how to care for yourself after your procedure. Your health care provider may also give you more specific instructions. If you have problems or questions, contact your health care provider. What can I expect after the procedure? After the procedure, it is common to have: Tiredness. Little or no memory about what happened during or after the procedure. Impaired judgment when it comes to making decisions. Nausea or vomiting. Some trouble with balance. Follow these instructions at home: For the time period you were told by your health care provider:  Rest. Do not participate in activities where you could fall or become injured. Do not drive or use machinery. Do not drink alcohol. Do not take sleeping pills or medicines that cause drowsiness. Do not make important decisions or sign  legal documents. Do not take care of children on your own. Medicines Take over-the-counter and prescription medicines only as told by your health care provider. If you were prescribed antibiotics, take them as told by your health care provider. Do not stop using the antibiotic even if you start to feel better. Eating and drinking Follow instructions from your health care provider about what you may eat and drink. Drink enough fluid to keep your urine pale yellow. If you vomit: Drink clear fluids slowly and in small amounts as you are able. Clear fluids include water, ice chips, low-calorie sports drinks, and fruit juice that has water added to it (diluted fruit juice). Eat light and bland foods in small amounts as you are able. These foods include bananas, applesauce, rice, lean meats, toast, and crackers. General instructions  Have a responsible adult stay with you for the time you are told. It is important to have someone help care for you until you are awake and alert. If you have sleep apnea, surgery and some medicines can increase your risk for  breathing problems. Follow instructions from your health care provider about wearing your sleep device: When you are sleeping. This includes during daytime naps. While taking prescription pain medicines, sleeping medicines, or medicines that make you drowsy. Do not use any products that contain nicotine or tobacco. These products include cigarettes, chewing tobacco, and vaping devices, such as e-cigarettes. If you need help quitting, ask your health care provider. Contact a health care provider if: You feel nauseous or vomit every time you eat or drink. You feel light-headed. You are still sleepy or having trouble with balance after 24 hours. You get a rash. You have a fever. You have redness or swelling around the IV site. Get help right away if: You have trouble breathing. You have new confusion after you get home. These symptoms may be an emergency. Get help right away. Call 911. Do not wait to see if the symptoms will go away. Do not drive yourself to the hospital. This information is not intended to replace advice given to you by your health care provider. Make sure you discuss any questions you have with your health care provider. Document Revised: 08/17/2021 Document Reviewed: 08/17/2021 Elsevier Patient Education  2024 [employer].

## 2023-04-04 ENCOUNTER — Encounter (HOSPITAL_COMMUNITY): Payer: Self-pay

## 2023-04-04 ENCOUNTER — Encounter (HOSPITAL_COMMUNITY)
Admission: RE | Admit: 2023-04-04 | Discharge: 2023-04-04 | Disposition: A | Discharge status: Home / Self Care | Payer: No Typology Code available for payment source | Source: Ambulatory Visit | Attending: Internal Medicine | Admitting: Internal Medicine

## 2023-04-04 ENCOUNTER — Other Ambulatory Visit: Payer: Self-pay

## 2023-04-04 VITALS — BP 131/75 | HR 72 | Temp 97.8°F | Resp 18 | Ht 71.0 in | Wt 206.8 lb

## 2023-04-04 DIAGNOSIS — E119 Type 2 diabetes mellitus without complications: Secondary | ICD-10-CM | POA: Diagnosis not present

## 2023-04-04 DIAGNOSIS — D509 Iron deficiency anemia, unspecified: Secondary | ICD-10-CM | POA: Insufficient documentation

## 2023-04-04 DIAGNOSIS — Z01818 Encounter for other preprocedural examination: Secondary | ICD-10-CM | POA: Diagnosis present

## 2023-04-04 LAB — CBC WITH DIFFERENTIAL/PLATELET
Abs Immature Granulocytes: 0.06 10*3/uL (ref 0.00–0.07)
Basophils Absolute: 0.1 10*3/uL (ref 0.0–0.1)
Basophils Relative: 1 %
Eosinophils Absolute: 1.2 10*3/uL — ABNORMAL HIGH (ref 0.0–0.5)
Eosinophils Relative: 14 %
HCT: 48.7 % (ref 39.0–52.0)
Hemoglobin: 16.7 g/dL (ref 13.0–17.0)
Immature Granulocytes: 1 %
Lymphocytes Relative: 14 %
Lymphs Abs: 1.2 10*3/uL (ref 0.7–4.0)
MCH: 33.2 pg (ref 26.0–34.0)
MCHC: 34.3 g/dL (ref 30.0–36.0)
MCV: 96.8 fL (ref 80.0–100.0)
Monocytes Absolute: 0.8 10*3/uL (ref 0.1–1.0)
Monocytes Relative: 10 %
Neutro Abs: 5.1 10*3/uL (ref 1.7–7.7)
Neutrophils Relative %: 60 %
Platelets: 153 10*3/uL (ref 150–400)
RBC: 5.03 MIL/uL (ref 4.22–5.81)
RDW: 14.9 % (ref 11.5–15.5)
WBC: 8.5 10*3/uL (ref 4.0–10.5)
nRBC: 0 % (ref 0.0–0.2)

## 2023-04-04 LAB — BASIC METABOLIC PANEL
Anion gap: 9 (ref 5–15)
BUN: 15 mg/dL (ref 8–23)
CO2: 24 mmol/L (ref 22–32)
Calcium: 9.6 mg/dL (ref 8.9–10.3)
Chloride: 103 mmol/L (ref 98–111)
Creatinine, Ser: 1.03 mg/dL (ref 0.61–1.24)
GFR, Estimated: >60 mL/min (ref >60)
Glucose, Bld: 133 mg/dL — ABNORMAL HIGH (ref 70–99)
Potassium: 4 mmol/L (ref 3.5–5.1)
Sodium: 136 mmol/L (ref 135–145)

## 2023-04-04 NOTE — Progress Notes (Signed)
PAT visit completed. Pt verbalized understanding on procedure instructions and arrival time.   

## 2023-04-07 NOTE — Progress Notes (Signed)
 Called patient to come in at 0600 04/07/22 instead of 0645 due to a first case cancellation.  Patient states he will try his best.

## 2023-04-08 ENCOUNTER — Ambulatory Visit (HOSPITAL_COMMUNITY): Payer: No Typology Code available for payment source | Admitting: Anesthesiology

## 2023-04-08 ENCOUNTER — Encounter: Payer: Self-pay | Admitting: *Deleted

## 2023-04-08 ENCOUNTER — Encounter (HOSPITAL_COMMUNITY)
Admission: RE | Disposition: A | Discharge status: Home / Self Care | Payer: Self-pay | Source: Ambulatory Visit | Attending: Internal Medicine

## 2023-04-08 ENCOUNTER — Ambulatory Visit (HOSPITAL_COMMUNITY)
Admission: RE | Admit: 2023-04-08 | Discharge: 2023-04-08 | Disposition: A | Discharge status: Home / Self Care | Payer: No Typology Code available for payment source | Source: Ambulatory Visit | Attending: Internal Medicine | Admitting: Internal Medicine

## 2023-04-08 ENCOUNTER — Encounter (HOSPITAL_COMMUNITY): Payer: Self-pay | Admitting: Internal Medicine

## 2023-04-08 ENCOUNTER — Other Ambulatory Visit: Payer: Self-pay | Admitting: *Deleted

## 2023-04-08 DIAGNOSIS — K648 Other hemorrhoids: Secondary | ICD-10-CM | POA: Insufficient documentation

## 2023-04-08 DIAGNOSIS — K3189 Other diseases of stomach and duodenum: Secondary | ICD-10-CM | POA: Diagnosis not present

## 2023-04-08 DIAGNOSIS — K2970 Gastritis, unspecified, without bleeding: Secondary | ICD-10-CM

## 2023-04-08 DIAGNOSIS — I8500 Esophageal varices without bleeding: Secondary | ICD-10-CM

## 2023-04-08 DIAGNOSIS — R197 Diarrhea, unspecified: Secondary | ICD-10-CM

## 2023-04-08 DIAGNOSIS — D509 Iron deficiency anemia, unspecified: Secondary | ICD-10-CM | POA: Diagnosis present

## 2023-04-08 DIAGNOSIS — I10 Essential (primary) hypertension: Secondary | ICD-10-CM | POA: Diagnosis not present

## 2023-04-08 DIAGNOSIS — K5520 Angiodysplasia of colon without hemorrhage: Secondary | ICD-10-CM

## 2023-04-08 DIAGNOSIS — E119 Type 2 diabetes mellitus without complications: Secondary | ICD-10-CM | POA: Diagnosis not present

## 2023-04-08 HISTORY — PX: HOT HEMOSTASIS: SHX5433

## 2023-04-08 HISTORY — PX: BIOPSY: SHX5522

## 2023-04-08 HISTORY — PX: ENTEROSCOPY: SHX5533

## 2023-04-08 HISTORY — PX: COLONOSCOPY WITH PROPOFOL: SHX5780

## 2023-04-08 LAB — GLUCOSE, CAPILLARY: Glucose-Capillary: 91 mg/dL (ref 70–99)

## 2023-04-08 SURGERY — COLONOSCOPY WITH PROPOFOL
Anesthesia: General

## 2023-04-08 MED ORDER — GLYCOPYRROLATE PF 0.2 MG/ML IJ SOSY
PREFILLED_SYRINGE | INTRAMUSCULAR | Status: AC
  Filled 2023-04-08: qty 1

## 2023-04-08 MED ORDER — LIDOCAINE HCL (PF) 2 % IJ SOLN
INTRAMUSCULAR | Status: AC
  Filled 2023-04-08: qty 5

## 2023-04-08 MED ORDER — PROPOFOL 500 MG/50ML IV EMUL
INTRAVENOUS | Status: AC
  Filled 2023-04-08: qty 50

## 2023-04-08 MED ORDER — LIDOCAINE HCL (PF) 2 % IJ SOLN
INTRAMUSCULAR | Status: DC | PRN
  Administered 2023-04-08: 100 mg via INTRADERMAL

## 2023-04-08 MED ORDER — LIDOCAINE 2% (20 MG/ML) 5 ML SYRINGE: INTRAMUSCULAR | Status: DC | PRN

## 2023-04-08 MED ORDER — PROPOFOL 10 MG/ML IV BOLUS
INTRAVENOUS | Status: DC | PRN
  Administered 2023-04-08 (×2): 200 mg via INTRAVENOUS
  Administered 2023-04-08: 200 ug/kg/min via INTRAVENOUS
  Administered 2023-04-08: 200 mg via INTRAVENOUS

## 2023-04-08 MED ORDER — LACTATED RINGERS IV SOLN: INTRAVENOUS | Status: DC

## 2023-04-08 MED ORDER — STERILE WATER FOR IRRIGATION IR SOLN
Status: DC | PRN
  Administered 2023-04-08: 60 mL

## 2023-04-08 MED ORDER — GLYCOPYRROLATE PF 0.2 MG/ML IJ SOSY
PREFILLED_SYRINGE | INTRAMUSCULAR | Status: DC | PRN
  Administered 2023-04-08: .2 mg via INTRAVENOUS

## 2023-04-08 NOTE — Anesthesia Postprocedure Evaluation (Signed)
 Anesthesia Post Note  Patient: NAJEEB UPTAIN  Procedure(s) Performed: COLONOSCOPY WITH PROPOFOL  ENTEROSCOPY BIOPSY HOT HEMOSTASIS (ARGON PLASMA COAGULATION/BICAP)  Patient location during evaluation: Phase II Anesthesia Type: General Level of consciousness: awake Pain management: pain level controlled Vital Signs Assessment: post-procedure vital signs reviewed and stable Respiratory status: spontaneous breathing and respiratory function stable Cardiovascular status: blood pressure returned to baseline and stable Postop Assessment: no headache and no apparent nausea or vomiting Anesthetic complications: no Comments: Late entry   No notable events documented.   Last Vitals:  Vitals:   04/08/23 0833 04/08/23 0834  BP:  137/83  Pulse: 97   Resp: 20   Temp: (!) 36.4 C   SpO2:      Last Pain:  Vitals:   04/08/23 0833  TempSrc: Oral  PainSc:                  Yvonna JINNY Bosworth

## 2023-04-08 NOTE — Anesthesia Preprocedure Evaluation (Signed)
 Anesthesia Evaluation  Patient identified by MRN, date of birth, ID band Patient awake    Reviewed: Allergy & Precautions, H&P , NPO status , Patient's Chart, lab work & pertinent test results, reviewed documented beta blocker date and time   Airway Mallampati: II  TM Distance: >3 FB Neck ROM: full    Dental no notable dental hx.    Pulmonary neg pulmonary ROS   Pulmonary exam normal breath sounds clear to auscultation       Cardiovascular Exercise Tolerance: Good hypertension, + angina  negative cardio ROS + dysrhythmias  Rhythm:regular Rate:Normal     Neuro/Psych negative neurological ROS  negative psych ROS   GI/Hepatic negative GI ROS, Neg liver ROS, PUD,,,  Endo/Other  negative endocrine ROSdiabetes    Renal/GU negative Renal ROS  negative genitourinary   Musculoskeletal   Abdominal   Peds  Hematology negative hematology ROS (+) Blood dyscrasia, anemia   Anesthesia Other Findings   Reproductive/Obstetrics negative OB ROS                             Anesthesia Physical Anesthesia Plan  ASA: 2  Anesthesia Plan: General   Post-op Pain Management:    Induction:   PONV Risk Score and Plan: Propofol  infusion  Airway Management Planned:   Additional Equipment:   Intra-op Plan:   Post-operative Plan:   Informed Consent: I have reviewed the patients History and Physical, chart, labs and discussed the procedure including the risks, benefits and alternatives for the proposed anesthesia with the patient or authorized representative who has indicated his/her understanding and acceptance.     Dental Advisory Given  Plan Discussed with: CRNA  Anesthesia Plan Comments:        Anesthesia Quick Evaluation

## 2023-04-08 NOTE — Transfer of Care (Signed)
 Immediate Anesthesia Transfer of Care Note  Patient: Charles Blackwell  Procedure(s) Performed: COLONOSCOPY WITH PROPOFOL  ENTEROSCOPY BIOPSY HOT HEMOSTASIS (ARGON PLASMA COAGULATION/BICAP)  Patient Location: Endoscopy Unit  Anesthesia Type:General  Level of Consciousness: awake, alert , and oriented  Airway & Oxygen Therapy: Patient Spontanous Breathing  Post-op Assessment: Report given to RN and Post -op Vital signs reviewed and stable  Post vital signs: Reviewed and stable  Last Vitals:  Vitals Value Taken Time  BP 137/83 04/08/23 0834  Temp 36.4 C 04/08/23 0833  Pulse 97 04/08/23 0833  Resp 20 04/08/23 0833  SpO2      Last Pain:  Vitals:   04/08/23 0833  TempSrc: Oral  PainSc:          Complications: No notable events documented.

## 2023-04-08 NOTE — H&P (Signed)
 Primary Care Physician:  Dasie Perkins, PA Primary Gastroenterologist:  Dr. Cindie  Pre-Procedure History & Physical: HPI:  Charles Blackwell is a 68 y.o. male is here for an enteroscopy and  colonoscopy to be performed for iron deficiency anemia and diarrhea.   Past Medical History:  Diagnosis Date   Anemia    Chronic back pain    Diabetes (HCC)    GI bleeding 06/2020   Hyperlipidemia    Peptic ulcer    Torn rotator cuff     Past Surgical History:  Procedure Laterality Date   BACK SURGERY     X 4, rods and pins, took bone out of hip and put in back, removal of rods    BIOPSY  06/22/2020   Procedure: BIOPSY;  Surgeon: Eda Iha, MD;  Location: [facility] ENDOSCOPY;  Service: Gastroenterology;;   COLONOSCOPY WITH PROPOFOL  N/A 06/22/2020   Procedure: COLONOSCOPY WITH PROPOFOL ;  Surgeon: Eda Iha, MD;  Location: [facility] ENDOSCOPY;  Service: Gastroenterology;  Laterality: N/A;   ESOPHAGOGASTRODUODENOSCOPY (EGD) WITH PROPOFOL  N/A 06/22/2020   Procedure: ESOPHAGOGASTRODUODENOSCOPY (EGD) WITH PROPOFOL ;  Surgeon: Eda Iha, MD;  Location: [facility] ENDOSCOPY;  Service: Gastroenterology;  Laterality: N/A;   POLYPECTOMY  06/22/2020   Procedure: POLYPECTOMY;  Surgeon: Eda Iha, MD;  Location: [facility] ENDOSCOPY;  Service: Gastroenterology;;    Prior to Admission medications   Medication Sig Start Date End Date Taking? Authorizing Provider  Cholecalciferol (VITAMIN D-3) 125 MCG (5000 UT) TABS Take 5,000 Units by mouth in the morning and at bedtime.   Yes [provider]  loperamide (IMODIUM A-D) 2 MG tablet Take 4 mg by mouth daily.   Yes [provider]  methadone  (DOLOPHINE ) 10 MG tablet Take 10 mg by mouth in the morning, at noon, and at bedtime. 06/13/20  Yes [provider]  metoprolol tartrate (LOPRESSOR) 25 MG tablet Take 12.5 mg by mouth 2 (two) times daily.   Yes [provider]  oxyCODONE-acetaminophen  (PERCOCET) 10-325 MG tablet Take 1  tablet by mouth 2 (two) times daily as needed for pain. 11/09/20  Yes [provider]  pantoprazole  (PROTONIX ) 40 MG tablet Take 1 tablet (40 mg total) by mouth 2 (two) times daily. Take 40mg  Po BID for 4weeks and then Once daily 06/22/20  Yes Joseph, Preetha, MD  testosterone cypionate (DEPOTESTOSTERONE CYPIONATE) 200 MG/ML injection Inject 200 mg into the muscle every 14 (fourteen) days. 05/30/20  Yes [provider]    Allergies as of 03/14/2023   (No Known Allergies)    Family History  Problem Relation Age of Onset   Cancer Mother        unknown kind of cancer   Heart disease Paternal Uncle    Colon cancer Neg Hx    Colon polyps Neg Hx     Social History   Socioeconomic History   Marital status: Married    Spouse name: Not on file   Number of children: 2   Years of education: Not on file   Highest education level: Not on file  Occupational History   Occupation: disability  Tobacco Use   Smoking status: Never   Smokeless tobacco: Never  Vaping Use   Vaping status: Never Used  Substance and Sexual Activity   Alcohol use: Never   Drug use: Not Currently   Sexual activity: Yes  Other Topics Concern   Not on file  Social History Narrative   Not on file   Social Drivers of Health   Financial Resource Strain: Not  on file  Food Insecurity: Not on file  Transportation Needs: Not on file  Physical Activity: Not on file  Stress: Not on file  Social Connections: Unknown (10/29/2022)   Received from [facility]   Social Network    Social Network: Not on file  Intimate Partner Violence: Unknown (10/29/2022)   Received from [facility]   HITS    Physically Hurt: Not on file    Insult or Talk Down To: Not on file    Threaten Physical Harm: Not on file    Scream or Curse: Not on file    Review of Systems: See HPI, otherwise negative ROS  Physical Exam: Vital signs in last 24 hours: Temp:  [98.3 F (36.8 C)] 98.3 F (36.8 C) (01/03 0627) Pulse  Rate:  [84] 84 (01/03 0627) Resp:  [12] 12 (01/03 0627) BP: (155)/(84) 155/84 (01/03 0627) SpO2:  [98 %] 98 % (01/03 0627) Weight:  [93.8 kg] 93.8 kg (01/03 0627)   General:   Alert,  Well-developed, well-nourished, pleasant and cooperative in NAD Head:  Normocephalic and atraumatic. Eyes:  Sclera clear, no icterus.   Conjunctiva pink. Ears:  Normal auditory acuity. Nose:  No deformity, discharge,  or lesions. Msk:  Symmetrical without gross deformities. Normal posture. Extremities:  Without clubbing or edema. Neurologic:  Alert and  oriented x4;  grossly normal neurologically. Skin:  Intact without significant lesions or rashes. Psych:  Alert and cooperative. Normal mood and affect.  Impression/Plan: Charles Blackwell is here for an enteroscopy and  colonoscopy to be performed for iron deficiency anemia and diarrhea.   The risks of the procedure including infection, bleed, or perforation as well as benefits, limitations, alternatives and imponderables have been reviewed with the patient. Questions have been answered. All parties agreeable.

## 2023-04-08 NOTE — Addendum Note (Signed)
 Addended by: Elinor Dodge on: 04/08/2023 11:46 AM   Modules accepted: Orders

## 2023-04-08 NOTE — Op Note (Signed)
 [facility] Patient Name: Charles Blackwell Procedure Date: 04/08/2023 7:11 AM MRN: [id number] Date of Birth: [birth year] Attending MD: Carlin POUR. Cindie , [place], [id number] CSN: [id number] Age: 68 Admit Type: Outpatient Procedure:                Colonoscopy Indications:              Unexplained iron deficiency anemia Providers:                Carlin POUR. Cindie, DO, Devere Lodge, Dorcas Lenis,                            Technician Referring MD:              Medicines:                See the Anesthesia note for documentation of the                            administered medications Complications:            No immediate complications. Estimated Blood Loss:     Estimated blood loss: none. Procedure:                Pre-Anesthesia Assessment:                           - The anesthesia plan was to use monitored                            anesthesia care (MAC).                           After obtaining informed consent, the colonoscope                            was passed under direct vision. Throughout the                            procedure, the patient's blood pressure, pulse, and                            oxygen saturations were monitored continuously. The                            [id number]-HQ190L ([id number]) scope was introduced through                            the anus and advanced to the the cecum, identified                            by appendiceal orifice and ileocecal valve. The                            colonoscopy was performed without difficulty. The                            patient tolerated the procedure well. The quality  of the bowel preparation was evaluated using the                            BBPS [facility] Bowel Preparation Scale) with scores                            of: Right Colon = 3, Transverse Colon = 3 and Left                            Colon = 3 (entire mucosa seen well with no residual                            staining, small fragments  of stool or opaque                            liquid). The total BBPS score equals 9. Scope In: 8:10:20 AM Scope Out: 8:28:05 AM Scope Withdrawal Time: 0 hours 15 minutes 57 seconds  Total Procedure Duration: 0 hours 17 minutes 45 seconds  Findings:      Non-bleeding internal hemorrhoids were found during endoscopy.      Five medium-sized angiodysplastic lesions without bleeding were found in       the rectum, in the proximal transverse colon and in the ascending colon.       Coagulation for bleeding prevention using argon plasma was successful. Impression:               - Non-bleeding internal hemorrhoids.                           - Five non-bleeding colonic angiodysplastic                            lesions. Treated with argon plasma coagulation                            (APC).                           - No specimens collected. Moderate Sedation:      Per Anesthesia Care Recommendation:           - Patient has a contact number available for                            emergencies. The signs and symptoms of potential                            delayed complications were discussed with the                            patient. Return to normal activities tomorrow.                            Written discharge instructions were provided to the  patient.                           - Resume previous diet.                           - Continue present medications.                           - Return to GI clinic in 4 weeks. Procedure Code(s):        --- Professional ---                           [phone], Colonoscopy, flexible; with control of                            bleeding, any method Diagnosis Code(s):        --- Professional ---                           K55.20, Angiodysplasia of colon without hemorrhage                           K64.8, Other hemorrhoids                           D50.9, Iron deficiency anemia, unspecified CPT copyright 2022 American Medical  Association. All rights reserved. The codes documented in this report are preliminary and upon coder review may  be revised to meet current compliance requirements. Carlin POUR. Cindie, DO Carlin POUR. Cindie, DO 04/08/2023 8:41:57 AM This report has been signed electronically. Number of Addenda: 0

## 2023-04-08 NOTE — Op Note (Signed)
 [facility] Patient Name: Charles Blackwell Procedure Date: 04/08/2023 7:14 AM MRN: [id number] Date of Birth: [birth year] Attending MD: Carlin POUR. Cindie , [place], [id number] CSN: [id number] Age: 68 Admit Type: Outpatient Procedure:                Small bowel enteroscopy Indications:              Unexplained iron deficiency anemia Providers:                Carlin POUR. Cindie, DO, Devere Lodge, Dorcas Lenis,                            Technician Referring MD:              Medicines:                See the Anesthesia note for documentation of the                            administered medications Complications:            No immediate complications. Estimated Blood Loss:     Estimated blood loss was minimal. Procedure:                Pre-Anesthesia Assessment:                           - The anesthesia plan was to use monitored                            anesthesia care (MAC).                           After obtaining informed consent, the endoscope was                            passed under direct vision. Throughout the                            procedure, the patient's blood pressure, pulse, and                            oxygen saturations were monitored continuously. The                            [id number]) scope was introduced through                            the mouth and advanced to the proximal jejunum. The                            small bowel enteroscopy was accomplished without                            difficulty. The patient tolerated the procedure                            well. Scope In: 7:56:30 AM Scope Out:  8:06:07 AM Total Procedure Duration: 0 hours 9 minutes 37 seconds  Findings:      Four columns of grade I-II varices with no bleeding and no stigmata of       recent bleeding were found in the lower third of the esophagus. No red       wale signs were present.      Patchy moderate inflammation characterized by erythema was found in the       entire  examined stomach. Biopsies were taken with a cold forceps for       Helicobacter pylori testing.      Localized erythematous mucosa without active bleeding and with no       stigmata of bleeding was found in the duodenal bulb. Biopsies were taken       with a cold forceps for histology.      There was no evidence of significant pathology in the jejunum.      There was no evidence of significant pathology in the second portion of       the duodenum, in the third portion of the duodenum and in the fourth       portion of the duodenum.      A single protrusion with no bleeding and no stigmata of recent bleeding       was found in the gastric body. Unclear significance, extrinsic       compression? Impression:               - Grade I esophageal varices with no bleeding and                            no stigmata of recent bleeding.                           - Gastritis. Biopsied.                           - Erythematous duodenopathy.                           - The examined portion of the jejunum was normal.                           - Normal second portion of the duodenum, third                            portion of the duodenum and fourth portion of the                            duodenum. Moderate Sedation:      Per Anesthesia Care Recommendation:           - Patient has a contact number available for                            emergencies. The signs and symptoms of potential                            delayed complications were discussed with the  patient. Return to normal activities tomorrow.                            Written discharge instructions were provided to the                            patient.                           - Low sodium.                           - Continue present medications.                           - Return to GI clinic in 4 weeks.                           - WIll proceed with CT abd/pelvis to further                             evaluate for cirrhosis, portal hypertension, as                            well as gastric protrusion. Procedure Code(s):        --- Professional ---                           [id number], Small intestinal endoscopy, enteroscopy                            beyond second portion of duodenum, not including                            ileum; with biopsy, single or multiple Diagnosis Code(s):        --- Professional ---                           I85.00, Esophageal varices without bleeding                           K29.70, Gastritis, unspecified, without bleeding                           K31.89, Other diseases of stomach and duodenum                           D50.9, Iron deficiency anemia, unspecified CPT copyright 2022 American Medical Association. All rights reserved. The codes documented in this report are preliminary and upon coder review may  be revised to meet current compliance requirements. Carlin POUR. Cindie, DO Carlin POUR. Kiwanna Spraker, DO 04/08/2023 9:22:08 AM This report has been signed electronically. Number of Addenda: 0

## 2023-04-08 NOTE — Discharge Instructions (Signed)
 EGD/enteroscopy Discharge instructions Please read the instructions outlined below and refer to this sheet in the next few weeks. These discharge instructions provide you with general information on caring for yourself after you leave the hospital. Your doctor may also give you specific instructions. While your treatment has been planned according to the most current medical practices available, unavoidable complications occasionally occur. If you have any problems or questions after discharge, please call your doctor. ACTIVITY You may resume your regular activity but move at a slower pace for the next 24 hours.  Take frequent rest periods for the next 24 hours.  Walking will help expel (get rid of) the air and reduce the bloated feeling in your abdomen.  No driving for 24 hours (because of the anesthesia (medicine) used during the test).  You may shower.  Do not sign any important legal documents or operate any machinery for 24 hours (because of the anesthesia used during the test).  NUTRITION Drink plenty of fluids.  You may resume your normal diet.  Begin with a light meal and progress to your normal diet.  Avoid alcoholic beverages for 24 hours or as instructed by your caregiver.  MEDICATIONS You may resume your normal medications unless your caregiver tells you otherwise.  WHAT YOU CAN EXPECT TODAY You may experience abdominal discomfort such as a feeling of fullness or "gas" pains.  FOLLOW-UP Your doctor will discuss the results of your test with you.  SEEK IMMEDIATE MEDICAL ATTENTION IF ANY OF THE FOLLOWING OCCUR: Excessive nausea (feeling sick to your stomach) and/or vomiting.  Severe abdominal pain and distention (swelling).  Trouble swallowing.  Temperature over 101 F (37.8 C).  Rectal bleeding or vomiting of blood.     Colonoscopy Discharge Instructions  Read the instructions outlined below and refer to this sheet in the next few weeks. These discharge instructions  provide you with general information on caring for yourself after you leave the hospital. Your doctor may also give you specific instructions. While your treatment has been planned according to the most current medical practices available, unavoidable complications occasionally occur.   ACTIVITY You may resume your regular activity, but move at a slower pace for the next 24 hours.  Take frequent rest periods for the next 24 hours.  Walking will help get rid of the air and reduce the bloated feeling in your belly (abdomen).  No driving for 24 hours (because of the medicine (anesthesia) used during the test).   Do not sign any important legal documents or operate any machinery for 24 hours (because of the anesthesia used during the test).  NUTRITION Drink plenty of fluids.  You may resume your normal diet as instructed by your doctor.  Begin with a light meal and progress to your normal diet. Heavy or fried foods are harder to digest and may make you feel sick to your stomach (nauseated).  Avoid alcoholic beverages for 24 hours or as instructed.  MEDICATIONS You may resume your normal medications unless your doctor tells you otherwise.  WHAT YOU CAN EXPECT TODAY Some feelings of bloating in the abdomen.  Passage of more gas than usual.  Spotting of blood in your stool or on the toilet paper.  IF YOU HAD POLYPS REMOVED DURING THE COLONOSCOPY: No aspirin products for 7 days or as instructed.  No alcohol for 7 days or as instructed.  Eat a soft diet for the next 24 hours.  FINDING OUT THE RESULTS OF YOUR TEST Not all test results are  available during your visit. If your test results are not back during the visit, make an appointment with your caregiver to find out the results. Do not assume everything is normal if you have not heard from your caregiver or the medical facility. It is important for you to follow up on all of your test results.  SEEK IMMEDIATE MEDICAL ATTENTION IF: You have more  than a spotting of blood in your stool.  Your belly is swollen (abdominal distention).  You are nauseated or vomiting.  You have a temperature over 101.  You have abdominal pain or discomfort that is severe or gets worse throughout the day.   Your upper endoscopy/enteroscopy revealed significant inflammation in your stomach.  I took biopsies to rule infection with bacteria called H. pylori.  Also took samples of your small bowel which was mildly inflamed.  You also have engorged blood vessels of your lower esophagus concerning for esophageal varices.  This may be a sign of chronic liver disease.  I am going to order CT of your abdomen pelvis to further evaluate.  Recommend stopping alcohol use going forward.  Your colonoscopy did not reveal any polyps or evidence of colon cancer.  You did have 5 lesions called angiodysplasias which are blood vessels that can leak blood over time.  I treated all of these with thermal therapy today.  Follow-up in GI office in 4 to 6 weeks.  I hope you have a great rest of your week!  Carlin POUR. Cindie, D.O. Gastroenterology and Hepatology [facility]

## 2023-04-12 ENCOUNTER — Telehealth: Payer: Self-pay | Admitting: Internal Medicine

## 2023-04-12 LAB — SURGICAL PATHOLOGY

## 2023-04-12 NOTE — Telephone Encounter (Signed)
 Called and spoke to patient to schedule a follow up appt with Dr. Marletta Lor.  He [facility].  He is going to check before scheduling anything.

## 2023-04-13 ENCOUNTER — Telehealth: Payer: Self-pay | Admitting: Internal Medicine

## 2023-04-13 NOTE — Telephone Encounter (Signed)
 noted

## 2023-04-13 NOTE — Telephone Encounter (Signed)
 Noted, we can forward his CT results to the [place] once he has his completed.  Thank you

## 2023-04-13 NOTE — Telephone Encounter (Signed)
 I had a message yesterday from Dr Cindie to make patient a follow up with him in 4-6 weeks. I spoke to patient this morning and offered OV with [facility] for 05/12/2023 at 1030. Patient said he spoke to someone yesterday about he was going [facility] and no appointment would be needed with us .

## 2023-04-15 ENCOUNTER — Encounter (HOSPITAL_COMMUNITY): Payer: Self-pay | Admitting: Internal Medicine

## 2023-04-26 ENCOUNTER — Ambulatory Visit (HOSPITAL_BASED_OUTPATIENT_CLINIC_OR_DEPARTMENT_OTHER): Payer: Medicare Other

## 2023-04-28 NOTE — Telephone Encounter (Signed)
Pt cancelled his CT. He told the hospital that he was getting his care at the [place].

## 2023-05-05 ENCOUNTER — Ambulatory Visit: Payer: No Typology Code available for payment source | Admitting: Internal Medicine

## 2023-05-12 ENCOUNTER — Ambulatory Visit: Payer: No Typology Code available for payment source | Admitting: Internal Medicine

## 2023-07-04 NOTE — Progress Notes (Unsigned)
    [facility] [address] [place] [address] Phone: [phone]   Assessment and Plan:     There are no diagnoses linked to this encounter.  ***   Pertinent previous records reviewed include ***    Follow Up: ***     Subjective:   I,  , am serving as a Neurosurgeon for Doctor Richardean Sale  Chief Complaint: bilat shoulder pain   HPI:   07/05/2023 Patient is a 68 year old male with bilat shoulder pain. Patient states  Relevant Historical Information: ***  Additional pertinent review of systems negative.   Current Outpatient Medications:    Cholecalciferol (VITAMIN D-3) 125 MCG (5000 UT) TABS, Take 5,000 Units by mouth in the morning and at bedtime., Disp: , Rfl:    loperamide (IMODIUM A-D) 2 MG tablet, Take 4 mg by mouth daily., Disp: , Rfl:    methadone (DOLOPHINE) 10 MG tablet, Take 10 mg by mouth in the morning, at noon, and at bedtime., Disp: , Rfl:    metoprolol tartrate (LOPRESSOR) 25 MG tablet, Take 12.5 mg by mouth 2 (two) times daily., Disp: , Rfl:    oxyCODONE-acetaminophen (PERCOCET) 10-325 MG tablet, Take 1 tablet by mouth 2 (two) times daily as needed for pain., Disp: , Rfl:    pantoprazole (PROTONIX) 40 MG tablet, Take 1 tablet (40 mg total) by mouth 2 (two) times daily. Take 40mg  Po BID for 4weeks and then Once daily, Disp: 60 tablet, Rfl: 1   testosterone cypionate (DEPOTESTOSTERONE CYPIONATE) 200 MG/ML injection, Inject 200 mg into the muscle every 14 (fourteen) days., Disp: , Rfl:    Objective:     There were no vitals filed for this visit.    There is no height or weight on file to calculate BMI.    Physical Exam:    ***   Electronically signed by:  [facility] 7:35 AM 07/04/23

## 2023-07-05 ENCOUNTER — Ambulatory Visit (INDEPENDENT_AMBULATORY_CARE_PROVIDER_SITE_OTHER)

## 2023-07-05 ENCOUNTER — Ambulatory Visit (INDEPENDENT_AMBULATORY_CARE_PROVIDER_SITE_OTHER): Admitting: Sports Medicine

## 2023-07-05 VITALS — HR 56 | Ht 71.0 in | Wt 206.0 lb

## 2023-07-05 DIAGNOSIS — M19011 Primary osteoarthritis, right shoulder: Secondary | ICD-10-CM | POA: Diagnosis not present

## 2023-07-05 DIAGNOSIS — M25511 Pain in right shoulder: Secondary | ICD-10-CM

## 2023-07-05 DIAGNOSIS — M67911 Unspecified disorder of synovium and tendon, right shoulder: Secondary | ICD-10-CM | POA: Diagnosis not present

## 2023-07-05 DIAGNOSIS — M67912 Unspecified disorder of synovium and tendon, left shoulder: Secondary | ICD-10-CM | POA: Diagnosis not present

## 2023-07-05 DIAGNOSIS — M25512 Pain in left shoulder: Secondary | ICD-10-CM

## 2023-07-05 DIAGNOSIS — G8929 Other chronic pain: Secondary | ICD-10-CM

## 2023-07-05 DIAGNOSIS — M19012 Primary osteoarthritis, left shoulder: Secondary | ICD-10-CM

## 2023-07-05 NOTE — Patient Instructions (Addendum)
 Shoulder HEP 3-4 week follow up

## 2023-08-03 ENCOUNTER — Ambulatory Visit: Admitting: Sports Medicine

## 2023-08-09 NOTE — Progress Notes (Unsigned)
    Ben Jackson D.[facility] [address] [place] [address] Phone: [phone]   Assessment and Plan:     There are no diagnoses linked to this encounter.  ***   Pertinent previous records reviewed include ***    Follow Up: ***     Subjective:   I, Kinsey Karch, am serving as a Neurosurgeon for Doctor Ulysees Gander   Chief Complaint: bilat shoulder pain    HPI:    07/05/2023 Patient is a 68 year old male with bilat shoulder pain. Patient states would like to discuss PRP. Decreased ROM. Pain for about 5-6 weeks. Isnt able to sleep in the bed has to sit in recliner. Pain started in his neck and radiated down to his shoulder R. Ibu for the pain but he shouldn't, hx of stomach ulcer. No numbness or tingling. Decreased grip strength. No pain at rest   08/10/2023 Patient states   Relevant Historical Information: History of gastric ulcer, DM type II, RBBB  Additional pertinent review of systems negative.   Current Outpatient Medications:    Cholecalciferol (VITAMIN D-3) 125 MCG (5000 UT) TABS, Take 5,000 Units by mouth in the morning and at bedtime., Disp: , Rfl:    loperamide (IMODIUM A-D) 2 MG tablet, Take 4 mg by mouth daily., Disp: , Rfl:    methadone  (DOLOPHINE ) 10 MG tablet, Take 10 mg by mouth in the morning, at noon, and at bedtime., Disp: , Rfl:    metoprolol tartrate (LOPRESSOR) 25 MG tablet, Take 12.5 mg by mouth 2 (two) times daily., Disp: , Rfl:    oxyCODONE-acetaminophen  (PERCOCET) 10-325 MG tablet, Take 1 tablet by mouth 2 (two) times daily as needed for pain., Disp: , Rfl:    pantoprazole  (PROTONIX ) 40 MG tablet, Take 1 tablet (40 mg total) by mouth 2 (two) times daily. Take 40mg  Po BID for 4weeks and then Once daily, Disp: 60 tablet, Rfl: 1   testosterone cypionate (DEPOTESTOSTERONE CYPIONATE) 200 MG/ML injection, Inject 200 mg into the muscle every 14 (fourteen) days., Disp: , Rfl:    Objective:     There were no vitals  filed for this visit.    There is no height or weight on file to calculate BMI.    Physical Exam:    ***   Electronically signed by:  Marshall Skeeter D.[facility] 7:37 AM 08/09/23

## 2023-08-10 ENCOUNTER — Ambulatory Visit: Admitting: Sports Medicine

## 2023-08-10 ENCOUNTER — Other Ambulatory Visit: Payer: Self-pay

## 2023-08-10 VITALS — HR 87 | Ht 71.0 in | Wt 198.0 lb

## 2023-08-10 DIAGNOSIS — M19011 Primary osteoarthritis, right shoulder: Secondary | ICD-10-CM

## 2023-08-10 DIAGNOSIS — M67912 Unspecified disorder of synovium and tendon, left shoulder: Secondary | ICD-10-CM | POA: Diagnosis not present

## 2023-08-10 DIAGNOSIS — G8929 Other chronic pain: Secondary | ICD-10-CM

## 2023-08-10 DIAGNOSIS — M25511 Pain in right shoulder: Secondary | ICD-10-CM

## 2023-08-10 DIAGNOSIS — M19012 Primary osteoarthritis, left shoulder: Secondary | ICD-10-CM

## 2023-08-10 DIAGNOSIS — M67911 Unspecified disorder of synovium and tendon, right shoulder: Secondary | ICD-10-CM | POA: Diagnosis not present

## 2023-08-10 DIAGNOSIS — M25512 Pain in left shoulder: Secondary | ICD-10-CM | POA: Diagnosis not present

## 2023-08-10 NOTE — Patient Instructions (Addendum)
 Injected shoulders today. Follow up in 3 to 4 weeks.

## 2023-09-02 NOTE — Progress Notes (Deleted)
    Ben Jackson D.[facility] [address] [place] [address] Phone: [phone]   Assessment and Plan:     There are no diagnoses linked to this encounter.  ***   Pertinent previous records reviewed include ***    Follow Up: ***     Subjective:   I, Alleyah Twombly, am serving as a Neurosurgeon for Doctor Ulysees Gander   Chief Complaint: bilat shoulder pain    HPI:    07/05/2023 Patient is a 68 year old male with bilat shoulder pain. Patient states would like to discuss PRP. Decreased ROM. Pain for about 5-6 weeks. Isnt able to sleep in the bed has to sit in recliner. Pain started in his neck and radiated down to his shoulder R. Ibu for the pain but he shouldn't, hx of stomach ulcer. No numbness or tingling. Decreased grip strength. No pain at rest    08/10/2023 Patient states had some improvement and then the pain came back . ROM a little better   09/05/2023 Patient states   Relevant Historical Information: History of gastric ulcer, DM type II, RBBB  Additional pertinent review of systems negative.   Current Outpatient Medications:    Cholecalciferol (VITAMIN D-3) 125 MCG (5000 UT) TABS, Take 5,000 Units by mouth in the morning and at bedtime., Disp: , Rfl:    loperamide (IMODIUM A-D) 2 MG tablet, Take 4 mg by mouth daily., Disp: , Rfl:    methadone  (DOLOPHINE ) 10 MG tablet, Take 10 mg by mouth in the morning, at noon, and at bedtime., Disp: , Rfl:    metoprolol tartrate (LOPRESSOR) 25 MG tablet, Take 12.5 mg by mouth 2 (two) times daily., Disp: , Rfl:    oxyCODONE-acetaminophen  (PERCOCET) 10-325 MG tablet, Take 1 tablet by mouth 2 (two) times daily as needed for pain., Disp: , Rfl:    pantoprazole  (PROTONIX ) 40 MG tablet, Take 1 tablet (40 mg total) by mouth 2 (two) times daily. Take 40mg  Po BID for 4weeks and then Once daily, Disp: 60 tablet, Rfl: 1   testosterone cypionate (DEPOTESTOSTERONE CYPIONATE) 200 MG/ML injection, Inject 200 mg  into the muscle every 14 (fourteen) days., Disp: , Rfl:    Objective:     There were no vitals filed for this visit.    There is no height or weight on file to calculate BMI.    Physical Exam:    ***   Electronically signed by:  Marshall Skeeter D.[facility] 8:19 AM 09/02/23

## 2023-09-05 ENCOUNTER — Ambulatory Visit: Admitting: Sports Medicine
